# Patient Record
Sex: Female | Born: 1965 | Race: White | Hispanic: No | Marital: Married | Smoking: Never smoker
Health system: Southern US, Community
[De-identification: ages and names within clinical notes are randomized; demographics above are authoritative.]

## PROBLEM LIST (undated history)

## (undated) DIAGNOSIS — Z87442 Personal history of urinary calculi: Secondary | ICD-10-CM

## (undated) DIAGNOSIS — K8020 Calculus of gallbladder without cholecystitis without obstruction: Secondary | ICD-10-CM

## (undated) DIAGNOSIS — M1990 Unspecified osteoarthritis, unspecified site: Secondary | ICD-10-CM

## (undated) DIAGNOSIS — R7303 Prediabetes: Secondary | ICD-10-CM

## (undated) DIAGNOSIS — N200 Calculus of kidney: Secondary | ICD-10-CM

## (undated) DIAGNOSIS — K5792 Diverticulitis of intestine, part unspecified, without perforation or abscess without bleeding: Secondary | ICD-10-CM

## (undated) DIAGNOSIS — I10 Essential (primary) hypertension: Secondary | ICD-10-CM

## (undated) DIAGNOSIS — K5790 Diverticulosis of intestine, part unspecified, without perforation or abscess without bleeding: Secondary | ICD-10-CM

## (undated) DIAGNOSIS — E669 Obesity, unspecified: Secondary | ICD-10-CM

## (undated) DIAGNOSIS — E039 Hypothyroidism, unspecified: Secondary | ICD-10-CM

## (undated) HISTORY — PX: LAPAROSCOPIC TOTAL HYSTERECTOMY: SUR800

## (undated) HISTORY — DX: Unspecified osteoarthritis, unspecified site: M19.90

## (undated) HISTORY — DX: Obesity, unspecified: E66.9

## (undated) HISTORY — DX: Calculus of gallbladder without cholecystitis without obstruction: K80.20

## (undated) HISTORY — DX: Diverticulosis of intestine, part unspecified, without perforation or abscess without bleeding: K57.90

## (undated) HISTORY — DX: Hypothyroidism, unspecified: E03.9

## (undated) HISTORY — DX: Essential (primary) hypertension: I10

## (undated) HISTORY — PX: CHOLECYSTECTOMY: SHX55

## (undated) HISTORY — PX: ABDOMINAL HYSTERECTOMY: SHX81

## (undated) HISTORY — DX: Calculus of kidney: N20.0

---

## 1999-08-01 ENCOUNTER — Emergency Department (HOSPITAL_COMMUNITY): Admission: EM | Admit: 1999-08-01 | Discharge: 1999-08-01 | Payer: Self-pay | Admitting: Emergency Medicine

## 2000-04-01 ENCOUNTER — Other Ambulatory Visit: Admission: RE | Admit: 2000-04-01 | Discharge: 2000-04-01 | Payer: Self-pay | Admitting: Family Medicine

## 2004-02-16 HISTORY — PX: LAPAROSCOPIC TOTAL HYSTERECTOMY: SUR800

## 2005-02-15 HISTORY — PX: OTHER SURGICAL HISTORY: SHX169

## 2010-02-14 ENCOUNTER — Emergency Department (HOSPITAL_COMMUNITY)
Admission: EM | Admit: 2010-02-14 | Discharge: 2010-02-14 | Payer: Self-pay | Source: Home / Self Care | Admitting: Emergency Medicine

## 2012-04-15 ENCOUNTER — Encounter (HOSPITAL_COMMUNITY): Payer: Self-pay | Admitting: Physical Medicine and Rehabilitation

## 2012-04-15 ENCOUNTER — Emergency Department (HOSPITAL_COMMUNITY): Payer: BC Managed Care – PPO

## 2012-04-15 ENCOUNTER — Observation Stay (HOSPITAL_COMMUNITY)
Admission: EM | Admit: 2012-04-15 | Discharge: 2012-04-18 | Disposition: A | Discharge status: Home / Self Care | Payer: BC Managed Care – PPO | Attending: Internal Medicine | Admitting: Internal Medicine

## 2012-04-15 DIAGNOSIS — K5730 Diverticulosis of large intestine without perforation or abscess without bleeding: Secondary | ICD-10-CM | POA: Insufficient documentation

## 2012-04-15 DIAGNOSIS — R0602 Shortness of breath: Secondary | ICD-10-CM | POA: Insufficient documentation

## 2012-04-15 DIAGNOSIS — R1013 Epigastric pain: Principal | ICD-10-CM

## 2012-04-15 DIAGNOSIS — Z79899 Other long term (current) drug therapy: Secondary | ICD-10-CM | POA: Insufficient documentation

## 2012-04-15 DIAGNOSIS — Z7982 Long term (current) use of aspirin: Secondary | ICD-10-CM | POA: Insufficient documentation

## 2012-04-15 DIAGNOSIS — Z6841 Body Mass Index (BMI) 40.0 and over, adult: Secondary | ICD-10-CM

## 2012-04-15 DIAGNOSIS — R110 Nausea: Secondary | ICD-10-CM | POA: Insufficient documentation

## 2012-04-15 DIAGNOSIS — R0789 Other chest pain: Secondary | ICD-10-CM

## 2012-04-15 DIAGNOSIS — R52 Pain, unspecified: Secondary | ICD-10-CM | POA: Insufficient documentation

## 2012-04-15 DIAGNOSIS — E039 Hypothyroidism, unspecified: Secondary | ICD-10-CM

## 2012-04-15 DIAGNOSIS — R079 Chest pain, unspecified: Secondary | ICD-10-CM

## 2012-04-15 HISTORY — DX: Diverticulitis of intestine, part unspecified, without perforation or abscess without bleeding: K57.92

## 2012-04-15 LAB — BASIC METABOLIC PANEL
BUN: 9 mg/dL (ref 6–23)
Calcium: 9.6 mg/dL (ref 8.4–10.5)
GFR calc non Af Amer: >90 mL/min (ref >90)
Glucose, Bld: 121 mg/dL — ABNORMAL HIGH (ref 70–99)
Sodium: 140 mEq/L (ref 135–145)

## 2012-04-15 LAB — HEPATIC FUNCTION PANEL
ALT: 21 U/L (ref 0–35)
Alkaline Phosphatase: 84 U/L (ref 39–117)
Bilirubin, Direct: <0.1 mg/dL (ref 0.0–0.3)
Total Bilirubin: 0.2 mg/dL — ABNORMAL LOW (ref 0.3–1.2)

## 2012-04-15 LAB — URINE MICROSCOPIC-ADD ON

## 2012-04-15 LAB — CBC WITH DIFFERENTIAL/PLATELET
Eosinophils Absolute: 0.4 10*3/uL (ref 0.0–0.7)
Eosinophils Relative: 5 % (ref 0–5)
Lymphs Abs: 2.9 10*3/uL (ref 0.7–4.0)
MCH: 29.8 pg (ref 26.0–34.0)
MCV: 87.6 fL (ref 78.0–100.0)
Platelets: 275 10*3/uL (ref 150–400)
RBC: 5.33 MIL/uL — ABNORMAL HIGH (ref 3.87–5.11)

## 2012-04-15 LAB — URINALYSIS, ROUTINE W REFLEX MICROSCOPIC
Bilirubin Urine: NEGATIVE
Ketones, ur: NEGATIVE mg/dL
Protein, ur: NEGATIVE mg/dL
Urobilinogen, UA: 0.2 mg/dL (ref 0.0–1.0)

## 2012-04-15 LAB — POCT I-STAT TROPONIN I

## 2012-04-15 MED ORDER — ASPIRIN 81 MG PO CHEW
324.0000 mg | CHEWABLE_TABLET | Freq: Once | ORAL | Status: AC
  Administered 2012-04-15: 324 mg via ORAL
  Filled 2012-04-15: qty 4

## 2012-04-15 MED ORDER — GI COCKTAIL ~~LOC~~
30.0000 mL | Freq: Once | ORAL | Status: AC
  Administered 2012-04-15: 30 mL via ORAL
  Filled 2012-04-15: qty 30

## 2012-04-15 MED ORDER — PANTOPRAZOLE SODIUM 40 MG IV SOLR
40.0000 mg | Freq: Once | INTRAVENOUS | Status: AC
  Administered 2012-04-15: 40 mg via INTRAVENOUS
  Filled 2012-04-15: qty 40

## 2012-04-15 MED ORDER — ONDANSETRON HCL 4 MG/2ML IJ SOLN
4.0000 mg | Freq: Once | INTRAMUSCULAR | Status: AC
  Administered 2012-04-15: 4 mg via INTRAVENOUS
  Filled 2012-04-15: qty 2

## 2012-04-15 NOTE — ED Notes (Signed)
Pt presents to department for evaluation of midsternal non radiating chest pain. Sudden onset today. Pt states several episodes of intermittent pain. 3/10 at the time, increases with deep breathing. States "I feel like something is sitting on my chest." respirations unlabored. Pt is alert and oriented x4.

## 2012-04-15 NOTE — ED Provider Notes (Signed)
History     CSN: [id number]  Arrival date & time 04/15/12  1800   None     Chief Complaint  Patient presents with  . Chest Pain  . Shortness of Breath    (Consider location/radiation/quality/duration/timing/severity/associated sxs/prior treatment) HPI History provided by pt.   Pt developed non-radiating pain mid-line chest, described as an elephant laying on top of her, while dressing herself at 3:30pm today.  Intermittent, aggravated by walking and associated w/ SOB and nausea.  No pain currently but still nauseous.  Has had numbness of left hand for the past 3 days.  Has had intermittent pain in her abdomen as well.   Recent cough; no fever.  Denies trauma to chest and recent heavy lifting.  No RF for PE and denies LE edema/pain. Does not smoke cigarettes and no immediate FH MI.  Had a stress test at [facility] 4-5 years ago.   Past Medical History  Diagnosis Date  . Thyroid disease   . Diverticulitis     No past surgical history on file.  No family history on file.  History  Substance Use Topics  . Smoking status: Never Smoker   . Smokeless tobacco: Not on file  . Alcohol Use: No    OB History   Grav Para Term Preterm Abortions TAB SAB Ect Mult Living                  Review of Systems  All other systems reviewed and are negative.    Allergies  Review of patient's allergies indicates no known allergies.  Home Medications   Current Outpatient Rx  Name  Route  Sig  Dispense  Refill  . aspirin EC 81 MG tablet   Oral   Take 81 mg by mouth daily.         Marland Kitchen levothyroxine (SYNTHROID, LEVOTHROID) 50 MCG tablet   Oral   Take 50 mcg by mouth daily.           BP 150/89  Pulse 74  Temp(Src) 97.6 F (36.4 C) (Oral)  Resp 18  SpO2 98%  Physical Exam  Nursing note and vitals reviewed. Constitutional: She is oriented to person, place, and time. She appears well-developed and well-nourished. No distress.  obese  HENT:  Head:  Normocephalic and atraumatic.  Eyes:  Normal appearance  Neck: Normal range of motion.  Cardiovascular: Normal rate, regular rhythm and intact distal pulses.   Pulmonary/Chest: Effort normal and breath sounds normal. No respiratory distress. She exhibits no tenderness.  No pleuritic pain reported  Abdominal: Soft. Bowel sounds are normal. She exhibits no distension. There is no guarding.  Diffuse tenderness but most pronounced in RUQ and epigastric.  Surgical scars from cholecystectomy.  Musculoskeletal: Normal range of motion.  No peripheral edema or calf tenderness  Neurological: She is alert and oriented to person, place, and time.  Skin: Skin is warm and dry. No rash noted.  Psychiatric: She has a normal mood and affect. Her behavior is normal.    ED Course  Procedures (including critical care time)   Date: 04/16/2012  Rate: 65  Rhythm: normal sinus rhythm  QRS Axis: left  Intervals: normal  ST/T Wave abnormalities: normal  Conduction Disutrbances:none  Narrative Interpretation:   Old EKG Reviewed: unchanged   Labs Reviewed  CBC WITH DIFFERENTIAL - Abnormal; Notable for the following:    RBC 5.33 (*)    Hemoglobin 15.9 (*)    HCT 46.7 (*)    All  other components within normal limits  BASIC METABOLIC PANEL - Abnormal; Notable for the following:    Glucose, Bld 121 (*)    All other components within normal limits  HEPATIC FUNCTION PANEL  LIPASE, BLOOD  URINALYSIS, ROUTINE W REFLEX MICROSCOPIC  POCT I-STAT TROPONIN I   Dg Chest 2 View  04/15/2012  *RADIOLOGY REPORT*  Clinical Data: Chest pain with shortness of breath  CHEST - 2 VIEW  Comparison: 08/08/2011  Findings: The right diaphragm is moderately elevated which is stable.  The heart size and vascular pattern are normal.  The lungs are clear.  There are no pleural effusions.  IMPRESSION: No acute findings.   Original Report Authenticated By: Esperanza Heir, M.D.      1. Chest pain   2. Abdominal pain, acute,  epigastric   3. Chest pain on exertion       MDM  47yo F presents w/ chest pain.  Low risk CAD but characteristics of pain are concerning for ACS.  EKG non-ischemic and serial troponins negative.  Currently pain free and has received aspirin.  She has also received 4mg  IV morphine for pain in abdomen.  Pt does not meet requirements for ED CP protocol because BMI 41 and unable to walk on treadmill d/t arthritis.   [facility] consulted for admission.          Otilio Miu, PA-C 04/16/12 2135

## 2012-04-16 ENCOUNTER — Encounter (HOSPITAL_COMMUNITY): Payer: Self-pay | Admitting: Internal Medicine

## 2012-04-16 DIAGNOSIS — R079 Chest pain, unspecified: Secondary | ICD-10-CM

## 2012-04-16 DIAGNOSIS — R1013 Epigastric pain: Secondary | ICD-10-CM

## 2012-04-16 HISTORY — DX: Morbid (severe) obesity due to excess calories: E66.01

## 2012-04-16 HISTORY — DX: Epigastric pain: R10.13

## 2012-04-16 HISTORY — DX: Chest pain, unspecified: R07.9

## 2012-04-16 LAB — TROPONIN I
Troponin I: <0.3 ng/mL (ref <0.30)
Troponin I: <0.3 ng/mL (ref <0.30)

## 2012-04-16 LAB — LIPID PANEL
Cholesterol: 183 mg/dL (ref 0–200)
LDL Cholesterol: 112 mg/dL — ABNORMAL HIGH (ref 0–99)
Triglycerides: 117 mg/dL (ref <150)
VLDL: 23 mg/dL (ref 0–40)

## 2012-04-16 LAB — CBC
Hemoglobin: 14.5 g/dL (ref 12.0–15.0)
MCH: 29.1 pg (ref 26.0–34.0)
MCV: 87.6 fL (ref 78.0–100.0)
RBC: 4.98 MIL/uL (ref 3.87–5.11)
WBC: 9.3 10*3/uL (ref 4.0–10.5)

## 2012-04-16 LAB — COMPREHENSIVE METABOLIC PANEL
AST: 24 U/L (ref 0–37)
Albumin: 3.2 g/dL — ABNORMAL LOW (ref 3.5–5.2)
BUN: 11 mg/dL (ref 6–23)
Calcium: 9.1 mg/dL (ref 8.4–10.5)
Creatinine, Ser: 0.78 mg/dL (ref 0.50–1.10)

## 2012-04-16 LAB — POCT I-STAT TROPONIN I: Troponin i, poc: 0 ng/mL (ref 0.00–0.08)

## 2012-04-16 LAB — HEMOGLOBIN A1C
Hgb A1c MFr Bld: 5.7 % — ABNORMAL HIGH (ref <5.7)
Mean Plasma Glucose: 117 mg/dL — ABNORMAL HIGH (ref <117)

## 2012-04-16 LAB — MAGNESIUM: Magnesium: 2.2 mg/dL (ref 1.5–2.5)

## 2012-04-16 MED ORDER — PROMETHAZINE HCL 25 MG/ML IJ SOLN
12.5000 mg | Freq: Four times a day (QID) | INTRAMUSCULAR | Status: DC | PRN
  Administered 2012-04-16: 12.5 mg via INTRAVENOUS
  Filled 2012-04-16: qty 1

## 2012-04-16 MED ORDER — DOCUSATE SODIUM 100 MG PO CAPS
100.0000 mg | ORAL_CAPSULE | Freq: Two times a day (BID) | ORAL | Status: DC
  Administered 2012-04-16 – 2012-04-17 (×3): 100 mg via ORAL
  Filled 2012-04-16 (×6): qty 1

## 2012-04-16 MED ORDER — ACETAMINOPHEN 650 MG RE SUPP: 650.0000 mg | Freq: Four times a day (QID) | RECTAL | Status: DC | PRN

## 2012-04-16 MED ORDER — SODIUM CHLORIDE 0.9 % IV SOLN
INTRAVENOUS | Status: AC
  Administered 2012-04-16: 03:00:00 via INTRAVENOUS

## 2012-04-16 MED ORDER — MORPHINE SULFATE 4 MG/ML IJ SOLN
4.0000 mg | Freq: Once | INTRAMUSCULAR | Status: AC
  Administered 2012-04-16: 4 mg via INTRAVENOUS
  Filled 2012-04-16: qty 1

## 2012-04-16 MED ORDER — HYDROCODONE-ACETAMINOPHEN 5-325 MG PO TABS
1.0000 | ORAL_TABLET | ORAL | Status: DC | PRN
  Administered 2012-04-16: 1 via ORAL
  Filled 2012-04-16: qty 1

## 2012-04-16 MED ORDER — ENOXAPARIN SODIUM 40 MG/0.4ML ~~LOC~~ SOLN
40.0000 mg | SUBCUTANEOUS | Status: DC
  Administered 2012-04-16: 40 mg via SUBCUTANEOUS
  Filled 2012-04-16 (×3): qty 0.4

## 2012-04-16 MED ORDER — LEVOTHYROXINE SODIUM 50 MCG PO TABS
50.0000 ug | ORAL_TABLET | Freq: Every day | ORAL | Status: DC
  Administered 2012-04-16 – 2012-04-17 (×2): 50 ug via ORAL
  Filled 2012-04-16 (×3): qty 1

## 2012-04-16 MED ORDER — MORPHINE SULFATE 2 MG/ML IJ SOLN
2.0000 mg | INTRAMUSCULAR | Status: DC | PRN
  Administered 2012-04-16 (×2): 2 mg via INTRAVENOUS
  Filled 2012-04-16 (×2): qty 1

## 2012-04-16 MED ORDER — ACETAMINOPHEN 325 MG PO TABS
650.0000 mg | ORAL_TABLET | Freq: Four times a day (QID) | ORAL | Status: DC | PRN
  Administered 2012-04-17 – 2012-04-18 (×2): 650 mg via ORAL
  Filled 2012-04-16 (×2): qty 2

## 2012-04-16 MED ORDER — ONDANSETRON HCL 4 MG/2ML IJ SOLN
4.0000 mg | Freq: Four times a day (QID) | INTRAMUSCULAR | Status: DC | PRN
  Administered 2012-04-16 (×2): 4 mg via INTRAVENOUS
  Filled 2012-04-16 (×2): qty 2

## 2012-04-16 MED ORDER — ASPIRIN EC 81 MG PO TBEC
81.0000 mg | DELAYED_RELEASE_TABLET | Freq: Every day | ORAL | Status: DC
  Administered 2012-04-16 – 2012-04-17 (×2): 81 mg via ORAL
  Filled 2012-04-16 (×3): qty 1

## 2012-04-16 MED ORDER — PANTOPRAZOLE SODIUM 40 MG PO TBEC
40.0000 mg | DELAYED_RELEASE_TABLET | Freq: Every day | ORAL | Status: DC
  Administered 2012-04-16 – 2012-04-17 (×2): 40 mg via ORAL
  Filled 2012-04-16 (×2): qty 1

## 2012-04-16 MED ORDER — ONDANSETRON HCL 4 MG/2ML IJ SOLN: 4.0000 mg | Freq: Three times a day (TID) | INTRAMUSCULAR | Status: DC | PRN

## 2012-04-16 MED ORDER — SODIUM CHLORIDE 0.9 % IJ SOLN
3.0000 mL | Freq: Two times a day (BID) | INTRAMUSCULAR | Status: DC
  Administered 2012-04-16 – 2012-04-17 (×2): 3 mL via INTRAVENOUS

## 2012-04-16 MED ORDER — ONDANSETRON HCL 4 MG PO TABS: 4.0000 mg | ORAL_TABLET | Freq: Four times a day (QID) | ORAL | Status: DC | PRN

## 2012-04-16 MED ORDER — MORPHINE SULFATE 4 MG/ML IJ SOLN
4.0000 mg | Freq: Once | INTRAMUSCULAR | Status: AC
  Administered 2012-04-16: 2 mg via INTRAVENOUS
  Filled 2012-04-16: qty 1

## 2012-04-16 NOTE — Progress Notes (Addendum)
[facility] PROGRESS NOTE  Kristena Wilhelmi FAO:[id number] DOB: Oct 01, 1965 DOA: 04/15/2012 PCP: Dema Severin, NP  Brief Narrative: Autumn [name] is a 47 y.o. female has a past medical history of Thyroid disease and Diverticulitis. Presented with Chest pain started around 3 pm and at first was central in her chest at the time she was getting dressed for work. The pain seemed to be worse when when tried to get up and walk. She felt the pain was worse with deep breathes. This was associated with shortness of breath and nausea. The pain was waxing and waining but resolved while in ED. She has been chest pain free for some time and then developed RUQ and epigastric pain that was described as spasm lasting about 15 min. Patient is sp cholecystectomy for the past 1 year. She states she has occasional epigastric pain at night. Her lipase and LFT's were within normal limits earlier. No evidence of leukocytosis. Hospitalist was called for admition for further work up. At this time patient is chest pain free and her abdominal pain is resolving.   Assessment/Plan: 1. Chest pain on exertion - atypical presentation with pleuritic component. EKG grossly unchanged this morning when compared to last night. T wave are flattened in V1-3, III and aVF but I do not appreciate changes since last night. Negative troponin this morning, 2 more values pending. Echo pending. Will repeat EKG this afternoon. If EKG changes, troponin is positive or echo is abnormal, will consult cardiology. She is chest pain free now. She seems to have 2 types of pain, the chest pain that was yesterday for ~5 min which is somewhat pleuritic in nature and the epigastric pain which is more constant. Will continue to closely monitor.   2. Abdominal pain, acute, epigastric - LFT's and lipase wnl, she is sp cholecystectomy, repeat lipase in AM.  if persistent pain may need further work up.   3. Hypothyroidism - TSH pending.   Code Status: Full Family  Communication: none  Disposition Plan: TBD  Consultants:  none  Procedures:  none  Antibiotics:  none  HPI/Subjective: - endorses mild HA and epigastric pain this morning  Objective: Filed Vitals:   04/15/12 2306 04/16/12 0015 04/16/12 0130 04/16/12 0255  BP: 130/66 126/70 121/43 136/81  Pulse: 67 57 63 64  Temp:    97.5 F (36.4 C)  TempSrc:      Resp: 16 19 14 15   Height:    5\' 7"  (1.702 m)  Weight:    121.7 kg (268 lb 4.8 oz)  SpO2: 93% 95% 94% 94%    Intake/Output Summary (Last 24 hours) at 04/16/12 6962 Last data filed at 04/16/12 0700  Gross per 24 hour  Intake    285 ml  Output      0 ml  Net    285 ml   Filed Weights   04/16/12 0255  Weight: 121.7 kg (268 lb 4.8 oz)    Exam:   General:  NAD, obese caucasian F   Cardiovascular: regular rate and rhythm, without MRG  Respiratory: good air movement, clear to auscultation throughout, no wheezing, ronchi or rales  Abdomen: soft, mild tenderness to palpation, positive bowel sounds  MSK: no peripheral edema  Neuro: CN 2-12 grossly intact, MS 5/5 in all 4  Data Reviewed: Basic Metabolic Panel:  Recent Labs Lab 04/15/12 1836 04/16/12 0519  NA 140 139  K 4.3 3.8  CL 103 104  CO2 28 27  GLUCOSE 121* 107*  BUN 9 11  CREATININE 0.63 0.78  CALCIUM 9.6 9.1  MG  --  2.2  PHOS  --  3.9   Liver Function Tests:  Recent Labs Lab 04/15/12 2229 04/16/12 0519  AST 23 24  ALT 21 23  ALKPHOS 84 79  BILITOT 0.2* 0.3  PROT 7.3 6.7  ALBUMIN 3.4* 3.2*    Recent Labs Lab 04/15/12 2229 04/16/12 0519  LIPASE 22 27   CBC:  Recent Labs Lab 04/15/12 1836 04/16/12 0519  WBC 9.3 9.3  NEUTROABS 5.0  --   HGB 15.9* 14.5  HCT 46.7* 43.6  MCV 87.6 87.6  PLT 275 258   Cardiac Enzymes:  Recent Labs Lab 04/16/12 0519  TROPONINI <0.30   Studies: Dg Chest 2 View  04/15/2012  *RADIOLOGY REPORT*  Clinical Data: Chest pain with shortness of breath  CHEST - 2 VIEW  Comparison: 08/08/2011   Findings: The right diaphragm is moderately elevated which is stable.  The heart size and vascular pattern are normal.  The lungs are clear.  There are no pleural effusions.  IMPRESSION: No acute findings.   Original Report Authenticated By: Esperanza Heir, M.D.     Scheduled Meds: . aspirin EC  81 mg Oral Daily  . docusate sodium  100 mg Oral BID  . enoxaparin (LOVENOX) injection  40 mg Subcutaneous Q24H  . levothyroxine  50 mcg Oral QAC breakfast  . pantoprazole  40 mg Oral Q1200  . sodium chloride  3 mL Intravenous Q12H   Continuous Infusions: . sodium chloride 75 mL/hr at 04/16/12 1610    Active Problems:   Chest pain on exertion   Morbid obesity with BMI of 40.0-44.9, adult   Abdominal pain, acute, epigastric  Time spent: 8  Pamella Pert, MD [facility] Pager [phone]. If 7 PM - 7 AM, please contact night-coverage at [email or web address], password [facility] 04/16/2012, 8:22 AM  LOS: 1 day

## 2012-04-16 NOTE — H&P (Signed)
PCP:  Dema Severin, NP [facility] [place]   Chief Complaint:   Chest pain  HPI: Autumn [name] is a 47 y.o. female   has a past medical history of Thyroid disease and Diverticulitis.   Presented with  Chest pain started around 3 pm and at first was central in her chest at the time she was getting dressed for work. The pain seemed to be worse when when tried to get up and walk. She felt the pain was worse with deep breathes. This was associated with shortness of breath and nausea. The pain was waxing and waining but resolved while in ED. She has been chest pain free for some time and then developed RUQ and epigastric pain that was described as spasm lasting about 15 min.  Patient is sp cholecystectomy for the past 1 year. She states she has occasional epigastric pain at night.  Her lipase and LFT's were within normal limits earlier. No evidence of leukocytosis. Hospitalist was called for admition for further work up. At this time patient is chest pain free and her abdominal pain is resolving.   Review of Systems:    Pertinent positives include: chills, abdominal pain, nausea, chest pain,   Constitutional:  No weight loss, night sweats, Fevers, fatigue, weight loss HEENT:  No headaches, Difficulty swallowing,Tooth/dental problems,Sore throat,  No sneezing, itching, ear ache, nasal congestion, post nasal drip,  Cardio-vascular:  No Orthopnea, PND, anasarca, dizziness, palpitations.no Bilateral lower extremity swelling  GI:  No heartburn, indigestion,  vomiting, diarrhea, change in bowel habits, loss of appetite, melena, blood in stool, hematemesis Resp:  no shortness of breath at rest. No dyspnea on exertion, No excess mucus, no productive cough, No non-productive cough, No coughing up of blood.No change in color of mucus.No wheezing. Skin:  no rash or lesions. No jaundice GU:  no dysuria, change in color of urine, no urgency or frequency. No straining to urinate.  No flank pain.   Musculoskeletal:  No joint pain or no joint swelling. No decreased range of motion. No back pain.  Psych:  No change in mood or affect. No depression or anxiety. No memory loss.  Neuro: no localizing neurological complaints, no tingling, no weakness, no double vision, no gait abnormality, no slurred speech, no confusion  Otherwise ROS are negative except for above, 10 systems were reviewed  Past Medical History: Past Medical History  Diagnosis Date  . Thyroid disease   . Diverticulitis    History reviewed. No pertinent past surgical history.   Medications: Prior to Admission medications   Medication Sig Start Date End Date Taking? Authorizing Provider  aspirin EC 81 MG tablet Take 81 mg by mouth daily.   Yes Historical Provider, MD  levothyroxine (SYNTHROID, LEVOTHROID) 50 MCG tablet Take 50 mcg by mouth daily.   Yes Historical Provider, MD    Allergies:  No Known Allergies  Social History:  Ambulatory independently   Lives at  Home with family   reports that she has never smoked. She does not have any smokeless tobacco history on file. She reports that she does not drink alcohol or use illicit drugs.   Family History: family history includes Diabetes in her mother and Hypertension in her mother.    Physical Exam: Patient Vitals for the past 24 hrs:  BP Temp Temp src Pulse Resp SpO2  04/16/12 0015 126/70 mmHg - - 57 19 95 %  04/15/12 2306 130/66 mmHg - - 67 16 93 %  04/15/12 1813 150/89 mmHg 97.6  F (36.4 C) Oral 74 18 98 %    1. General:  in No Acute distress 2. Psychological: Alert and   Oriented 3. Head/ENT:   Moist   Mucous Membranes                          Head Non traumatic, neck supple                          Normal   Dentition 4. SKIN:   decreased Skin turgor,  Skin clean Dry and intact no rash 5. Heart: Regular rate and rhythm no Murmur, Rub or gallop 6. Lungs: Clear to auscultation bilaterally, no wheezes or crackles   7. Abdomen: Soft, epigastric  tenderness, Non distended 8. Lower extremities: no clubbing, cyanosis, or edema 9. Neurologically Grossly intact, moving all 4 extremities equally 10. MSK: Normal range of motion  body mass index is unknown because there is no height or weight on file.   Labs on Admission:   Recent Labs  04/15/12 1836  NA 140  K 4.3  CL 103  CO2 28  GLUCOSE 121*  BUN 9  CREATININE 0.63  CALCIUM 9.6    Recent Labs  04/15/12 2229  AST 23  ALT 21  ALKPHOS 84  BILITOT 0.2*  PROT 7.3  ALBUMIN 3.4*    Recent Labs  04/15/12 2229  LIPASE 22    Recent Labs  04/15/12 1836  WBC 9.3  NEUTROABS 5.0  HGB 15.9*  HCT 46.7*  MCV 87.6  PLT 275   No results found for this basename: CKTOTAL, CKMB, CKMBINDEX, TROPONINI,  in the last 72 hours No results found for this basename: TSH, T4TOTAL, FREET3, T3FREE, THYROIDAB,  in the last 72 hours No results found for this basename: VITAMINB12, FOLATE, FERRITIN, TIBC, IRON, RETICCTPCT,  in the last 72 hours No results found for this basename: HGBA1C    CrCl is unknown because there is no height on file for the current visit. ABG No results found for this basename: phart, pco2, po2, hco3, tco2, acidbasedef, o2sat     No results found for this basename: DDIMER     Other results:  I have pearsonaly reviewed this: ECG REPORT  Rate: 67  Rhythm: NSR ST&T Change: flat T waves in III, aVF, V3  UA no evidece of infection   Cultures: No results found for this basename: sdes, specrequest, cult, reptstatus       Radiological Exams on Admission: Dg Chest 2 View  04/15/2012  *RADIOLOGY REPORT*  Clinical Data: Chest pain with shortness of breath  CHEST - 2 VIEW  Comparison: 08/08/2011  Findings: The right diaphragm is moderately elevated which is stable.  The heart size and vascular pattern are normal.  The lungs are clear.  There are no pleural effusions.  IMPRESSION: No acute findings.   Original Report Authenticated By: Esperanza Heir, M.D.      Chart has been reviewed  Assessment/Plan  47 yo F w hx of morbid obesity with BMI of 41.   Present on Admission:  . Chest pain on exertion - atypical presentation with pleuritic component. CE negative x2 continue to cycle, will obtain d.dimer and repeat ECG . Abdominal pain, acute, epigastric  - LFT's and lipase wnl, she is sp cholecystectomy, repeat lipase in AM.   if persistent pain may need further work up.    Prophylaxis:  Lovenox, Protonix  CODE STATUS: FULL  CODE  Other plan as per orders.  I have spent a total of 55 min on this admission  Tocarra Gassen 04/16/2012, 1:18 AM

## 2012-04-16 NOTE — Progress Notes (Signed)
MD text paged on 12 lead done this AM indicating TWI in leads V1 - V3. awaitinf for reply. Also pt continues to c/o dull pain on the Right  Upper Quadrant area. She is had  also  been c/o of headache at 9/10 and mild nausea. Zofran 4 mg and Morphine given.  Thanks . Ancil Linsey RN

## 2012-04-17 MED ORDER — LEVOTHYROXINE SODIUM 75 MCG PO TABS
75.0000 ug | ORAL_TABLET | Freq: Every day | ORAL | Status: DC
  Administered 2012-04-18: 75 ug via ORAL
  Filled 2012-04-17 (×2): qty 1

## 2012-04-17 MED ORDER — LEVOTHYROXINE SODIUM 25 MCG PO TABS
25.0000 ug | ORAL_TABLET | Freq: Once | ORAL | Status: AC
  Administered 2012-04-17: 25 ug via ORAL
  Filled 2012-04-17: qty 1

## 2012-04-17 NOTE — ED Provider Notes (Signed)
Medical screening examination/treatment/procedure(s) were performed by non-physician practitioner and as supervising physician I was immediately available for consultation/collaboration.   Anabeth Chilcott L Harlene Petralia, MD 04/17/12 2333 

## 2012-04-17 NOTE — Progress Notes (Signed)
Notified by CCMD pts HR dropping to low 40's.  PT sleeping soundly--aroused easily--no complaints voiced.  Will continue to monitor. Dierdre Highman, RN

## 2012-04-17 NOTE — Progress Notes (Signed)
[facility] PROGRESS NOTE  Autumn [name] JXB:[id number] DOB: 1965/10/16 DOA: 04/15/2012 PCP: Dema Severin, NP  Brief Narrative: Autumn [name] is a 47 y.o. female has a past medical history of Thyroid disease and Diverticulitis. Presented with Chest pain started around 3 pm and at first was central in her chest at the time she was getting dressed for work. The pain seemed to be worse when when tried to get up and walk. She felt the pain was worse with deep breathes. This was associated with shortness of breath and nausea. The pain was waxing and waining but resolved while in ED. She has been chest pain free for some time and then developed RUQ and epigastric pain that was described as spasm lasting about 15 min. Patient is sp cholecystectomy for the past 1 year. She states she has occasional epigastric pain at night. Her lipase and LFT's were within normal limits earlier. No evidence of leukocytosis. Hospitalist was called for admition for further work up. At this time patient is chest pain free and her abdominal pain is resolving.   Assessment/Plan: 1. Chest pain on exertion - atypical presentation with pleuritic component. TTE pending.   2. Hypothyroidism - per patient, diagnosed at the end of January and her TSH was ~7 at that time. She has been taking synthroid 50 mcg daily and seems to take this medication correctly. TSH remains elevated and she is having bradycardia, endorses fatigue and fluid retention still. Will increase synthroid to 75 starting today. TTE pending.   3. Abdominal pain, acute, epigastric - LFT's and lipase wnl, she is sp cholecystectomy, repeat lipase in AM.  if persistent pain may need further work up.   4. Hypothyroidism - TSH pending.   Code Status: Full Family Communication: none  Disposition Plan: TBD  Consultants:  none  Procedures:  none  Antibiotics:  none  HPI/Subjective: - endorses mild HA and epigastric pain this morning  Objective: Filed  Vitals:   04/16/12 0255 04/16/12 1421 04/16/12 2100 04/17/12 0542  BP: 136/81 123/76 105/62 98/50  Pulse: 64 57 61 54  Temp: 97.5 F (36.4 C) 98.1 F (36.7 C) 98.1 F (36.7 C) 97.9 F (36.6 C)  TempSrc:  Oral Oral Oral  Resp: 15 18 18 18   Height: 5\' 7"  (1.702 m)     Weight: 121.7 kg (268 lb 4.8 oz)   120.9 kg (266 lb 8.6 oz)  SpO2: 94% 96% 98% 96%    Intake/Output Summary (Last 24 hours) at 04/17/12 1259 Last data filed at 04/17/12 0800  Gross per 24 hour  Intake    400 ml  Output      0 ml  Net    400 ml   Filed Weights   04/16/12 0255 04/17/12 0542  Weight: 121.7 kg (268 lb 4.8 oz) 120.9 kg (266 lb 8.6 oz)    Exam:   General:  NAD, obese caucasian F   Cardiovascular: regular rate and rhythm, without MRG  Respiratory: good air movement, clear to auscultation throughout, no wheezing, ronchi or rales  Abdomen: soft, mild tenderness to palpation, positive bowel sounds  MSK: no peripheral edema  Neuro: CN 2-12 grossly intact, MS 5/5 in all 4  Data Reviewed: Basic Metabolic Panel:  Recent Labs Lab 04/15/12 1836 04/16/12 0519  NA 140 139  K 4.3 3.8  CL 103 104  CO2 28 27  GLUCOSE 121* 107*  BUN 9 11  CREATININE 0.63 0.78  CALCIUM 9.6 9.1  MG  --  2.2  PHOS  --  3.9   Liver Function Tests:  Recent Labs Lab 04/15/12 2229 04/16/12 0519  AST 23 24  ALT 21 23  ALKPHOS 84 79  BILITOT 0.2* 0.3  PROT 7.3 6.7  ALBUMIN 3.4* 3.2*    Recent Labs Lab 04/15/12 2229 04/16/12 0519  LIPASE 22 27   CBC:  Recent Labs Lab 04/15/12 1836 04/16/12 0519  WBC 9.3 9.3  NEUTROABS 5.0  --   HGB 15.9* 14.5  HCT 46.7* 43.6  MCV 87.6 87.6  PLT 275 258   Cardiac Enzymes:  Recent Labs Lab 04/16/12 0519 04/16/12 1100 04/16/12 1619  TROPONINI <0.30 <0.30 <0.30   Studies: Dg Chest 2 View  04/15/2012  *RADIOLOGY REPORT*  Clinical Data: Chest pain with shortness of breath  CHEST - 2 VIEW  Comparison: 08/08/2011  Findings: The right diaphragm is moderately  elevated which is stable.  The heart size and vascular pattern are normal.  The lungs are clear.  There are no pleural effusions.  IMPRESSION: No acute findings.   Original Report Authenticated By: Esperanza Heir, M.D.     Scheduled Meds: . aspirin EC  81 mg Oral Daily  . docusate sodium  100 mg Oral BID  . enoxaparin (LOVENOX) injection  40 mg Subcutaneous Q24H  . [START ON 04/18/2012] levothyroxine  75 mcg Oral QAC breakfast  . pantoprazole  40 mg Oral Q1200  . sodium chloride  3 mL Intravenous Q12H   Continuous Infusions:    Active Problems:   Chest pain on exertion   Morbid obesity with BMI of 40.0-44.9, adult   Abdominal pain, acute, epigastric  Pamella Pert, MD [facility] Pager [phone]. If 7 PM - 7 AM, please contact night-coverage at [email or web address], password [facility] 04/17/2012, 12:59 PM  LOS: 2 days

## 2012-04-17 NOTE — Progress Notes (Signed)
*  PRELIMINARY RESULTS* Echocardiogram 2D Echocardiogram has been performed.  [name], Autumn 04/17/2012, 4:27 PM 

## 2012-04-18 MED ORDER — LEVOTHYROXINE SODIUM 75 MCG PO TABS: 75.0000 ug | ORAL_TABLET | Freq: Every day | ORAL | Status: DC

## 2012-04-18 NOTE — Discharge Summary (Signed)
Physician Discharge Summary  Autumn [name] QIO:[id number] DOB: [birth year] DOA: 04/15/2012  PCP: Dema Severin, NP  Admit date: 04/15/2012 Discharge date: 04/18/2012  Time spent: 35 minutes  Recommendations for Outpatient Follow-up:  1. Please follow up with PCP next week  Discharge Diagnoses:  Active Problems:   Chest pain on exertion   Morbid obesity with BMI of 40.0-44.9, adult   Abdominal pain, acute, epigastric  Discharge Condition: Stable  Diet recommendation: Heart healthy  Filed Weights   04/16/12 0255 04/17/12 0542 04/18/12 0555  Weight: 121.7 kg (268 lb 4.8 oz) 120.9 kg (266 lb 8.6 oz) 121 kg (266 lb 12.1 oz)    History of present illness:  Autumn [name] is a 47 y.o. female has a past medical history of Thyroid disease and Diverticulitis. Presented with Chest pain started around 3 pm and at first was central in her chest at the time she was getting dressed for work. The pain seemed to be worse when when tried to get up and walk. She felt the pain was worse with deep breathes. This was associated with shortness of breath and nausea. The pain was waxing and waining but resolved while in ED. She has been chest pain free for some time and then developed RUQ and epigastric pain that was described as spasm lasting about 15 min. Patient is sp cholecystectomy for the past 1 year. She states she has occasional epigastric pain at night. Her lipase and LFT's were within normal limits [facility] Course:  Chest pain - Patient had atypical presentation with pleuritic component. EKG showed T wave flattened in V1-3, III and aVF but we have no previous EKG to compare it with, and repeat EKGs were without change. Troponins were negative throughout her hospitalization. 2-D echocardiogram showed normal systolic function and normal diastolic function.  Her chest pain resolved after admission, the patient did not experience chest pain while hospitalized. On initial lab workup, her TSH came back  elevated at 8. She was recently diagnosed with hypothyroidism by her PCP and placed on Synthroid 50 mcg daily, and that was about 5 weeks ago. I increased her dose of Synthroid to 75 mcg daily, instructed patient to followup with PCP. She already has an appointment next week. As far as her abdominal pain is concerned, her abdominal pain resolved, liver function tests and lipase were within normal limits.  Procedures:  2D echo Study Conclusions  - Left ventricle: The cavity size was normal. Wall thickness was normal. Systolic function was normal. The estimated ejection fraction was in the range of 55% to 60%. Left ventricular diastolic function parameters were normal. - Left atrium: The atrium was mildly dilated.  Consultations:  none  Discharge Exam: Filed Vitals:   04/17/12 0542 04/17/12 1400 04/17/12 2100 04/18/12 0555  BP: 98/50 110/52 109/62 107/63  Pulse: 54 68 61 57  Temp: 97.9 F (36.6 C) 97.8 F (36.6 C) 97.9 F (36.6 C) 98 F (36.7 C)  TempSrc: Oral  Oral Oral  Resp: 18 17 18 18   Height:      Weight: 120.9 kg (266 lb 8.6 oz)   121 kg (266 lb 12.1 oz)  SpO2: 96% 94% 95% 95%    General: NAD Cardiovascular: RRR without MRG Respiratory: CTA biL  Discharge Instructions     Medication List    TAKE these medications       aspirin EC 81 MG tablet  Take 81 mg by mouth daily.     levothyroxine 75 MCG tablet  Commonly  known as:  SYNTHROID, LEVOTHROID  Take 1 tablet (75 mcg total) by mouth daily before breakfast.       The results of significant diagnostics from this hospitalization (including imaging, microbiology, ancillary and laboratory) are listed below for reference.    Significant Diagnostic Studies: Dg Chest 2 View  04/15/2012  *RADIOLOGY REPORT*  Clinical Data: Chest pain with shortness of breath  CHEST - 2 VIEW  Comparison: 08/08/2011  Findings: The right diaphragm is moderately elevated which is stable.  The heart size and vascular pattern are normal.   The lungs are clear.  There are no pleural effusions.  IMPRESSION: No acute findings.   Original Report Authenticated By: Esperanza Heir, M.D.    Labs: Basic Metabolic Panel:  Recent Labs Lab 04/15/12 1836 04/16/12 0519  NA 140 139  K 4.3 3.8  CL 103 104  CO2 28 27  GLUCOSE 121* 107*  BUN 9 11  CREATININE 0.63 0.78  CALCIUM 9.6 9.1  MG  --  2.2  PHOS  --  3.9   Lab Results  Component Value Date   TSH 8.117* 04/16/2012   Liver Function Tests:  Recent Labs Lab 04/15/12 2229 04/16/12 0519  AST 23 24  ALT 21 23  ALKPHOS 84 79  BILITOT 0.2* 0.3  PROT 7.3 6.7  ALBUMIN 3.4* 3.2*    Recent Labs Lab 04/15/12 2229 04/16/12 0519  LIPASE 22 27   CBC:  Recent Labs Lab 04/15/12 1836 04/16/12 0519  WBC 9.3 9.3  NEUTROABS 5.0  --   HGB 15.9* 14.5  HCT 46.7* 43.6  MCV 87.6 87.6  PLT 275 258   Cardiac Enzymes:  Recent Labs Lab 04/16/12 0519 04/16/12 1100 04/16/12 1619  TROPONINI <0.30 <0.30 <0.30    Signed:  Pamella Pert  [facility] 04/18/2012, 11:50 AM

## 2012-04-18 NOTE — Progress Notes (Signed)
Pt provided with dc instructions and education. Pt verbalized understanding. Pt aware of increase in synthroid and provided with prescription. Pt has no complaints at this time. IV removed with tip intact. Heart monitor cleaned and returned to front. Levonne Spiller, RN

## 2012-04-20 ENCOUNTER — Encounter: Payer: Self-pay | Admitting: Internal Medicine

## 2012-05-09 ENCOUNTER — Encounter: Payer: Self-pay | Admitting: Internal Medicine

## 2012-05-11 ENCOUNTER — Encounter: Payer: Self-pay | Admitting: Internal Medicine

## 2012-05-11 ENCOUNTER — Ambulatory Visit (INDEPENDENT_AMBULATORY_CARE_PROVIDER_SITE_OTHER): Payer: BC Managed Care – PPO | Admitting: Internal Medicine

## 2012-05-11 VITALS — BP 124/86 | HR 76 | Ht 65.25 in | Wt 271.2 lb

## 2012-05-11 DIAGNOSIS — R1011 Right upper quadrant pain: Secondary | ICD-10-CM

## 2012-05-11 DIAGNOSIS — R1013 Epigastric pain: Secondary | ICD-10-CM

## 2012-05-11 DIAGNOSIS — R110 Nausea: Secondary | ICD-10-CM

## 2012-05-11 MED ORDER — ONDANSETRON HCL 4 MG PO TABS: 4.0000 mg | ORAL_TABLET | ORAL | Status: DC | PRN

## 2012-05-11 MED ORDER — HYOSCYAMINE SULFATE 0.125 MG SL SUBL: 0.1250 mg | SUBLINGUAL_TABLET | SUBLINGUAL | Status: DC | PRN

## 2012-05-11 NOTE — Progress Notes (Signed)
Patient ID: Autumn [name], female   DOB: 1965-11-10, 47 y.o.   MRN: [id number] HPI: Autumn [name] is a 47 yo female with PMH of hypothyroidism, diverticulosis with history of diverticulitis, gallstones status post cholecystectomy who seen in consultation at the request of Mauricio Po, FNP for evaluation of epigastric abdominal pain and nausea.  The patient reports ongoing issues with epigastric and right-sided abdominal pain. The pain is in her right middle and right upper quadrant. She reports it feels like a cramping or spasm-type pain. It often wakes her up at night. It is associated with nausea but no vomiting. She's not having heartburn. She has normal swallowing. Her bowel movements have been normal with no blood or melena. She denies diarrhea. She's had diverticulitis before in her current pain is not consistent with this. She has had a colonoscopy before in 2012 which you were poor it was "fine". He has tried Zofran which seemed to help the nausea. She was empirically started on metoclopramide about 2 weeks ago which she has taken but can't tell any difference. She's had her gallbladder removed for a different type pain and nausea. When the pain occurs it can last anywhere from minutes to an hour.  No f/c. She was recently hospitalized with atypical chest pain and ruled out for myocardial infarction. She had an echocardiogram which she reports was normal (on review of the records EF was greater than 55, there was mild left atrial enlargement otherwise unremarkable).  She has not had any further chest pain and no dyspnea.  Past Medical History  Diagnosis Date  . Hypothyroidism   . Diverticulosis   . Arthritis   . Gallstones   . Obesity     Past Surgical History  Procedure Laterality Date  . Cholecystectomy    . Laparoscopic total hysterectomy      Current Outpatient Prescriptions  Medication Sig Dispense Refill  . aspirin EC 81 MG tablet Take 81 mg by mouth daily.      Marland Kitchen levothyroxine  (SYNTHROID, LEVOTHROID) 75 MCG tablet Take 1 tablet (75 mcg total) by mouth daily before breakfast.  30 tablet  1  . ondansetron (ZOFRAN) 4 MG tablet Take 1 tablet (4 mg total) by mouth as needed for nausea.  20 tablet  1  . hyoscyamine (LEVSIN/SL) 0.125 MG SL tablet Place 1 tablet (0.125 mg total) under the tongue every 4 (four) hours as needed for cramping.  30 tablet  1   No current facility-administered medications for this visit.    No Known Allergies  Family History  Problem Relation Age of Onset  . Hypertension Mother   . Diabetes Mother   . Lymphoma Mother   . Lung cancer Father   . Pancreatic cancer Paternal Aunt   . Cancer Paternal Aunt     unknown type  . Thyroid disease Mother     History   Social History  . Marital Status: Married    Spouse Name: N/A    Number of Children: 1  . Years of Education: N/A   Occupational History  . ASSOCIATE [employer]   Social History Main Topics  . Smoking status: Never Smoker   . Smokeless tobacco: Never Used  . Alcohol Use: No  . Drug Use: No  . Sexually Active: None   Other Topics Concern  . None   Social History Narrative  . None    ROS: As per history of present illness, otherwise negative  BP 124/86  Pulse 76  Ht 5' 5.25" (  1.657 m)  Wt 271 lb 4 oz (123.038 kg)  BMI 44.81 kg/m2 Constitutional: Well-developed and well-nourished. No distress. HEENT: Normocephalic and atraumatic. Oropharynx is clear and moist. No oropharyngeal exudate. Conjunctivae are normal.  No scleral icterus. Neck: Neck supple. Trachea midline. Cardiovascular: Normal rate, regular rhythm and intact distal pulses. No M/R/G Pulmonary/chest: Effort normal and breath sounds normal. No wheezing, rales or rhonchi. Abdominal: Soft, epigastric and right upper quadrant tenderness without rebound or guarding, nondistended. Bowel sounds active throughout.  Extremities: no clubbing, cyanosis, or edema Lymphadenopathy: No cervical adenopathy  noted. Neurological: Alert and oriented to person place and time. Skin: Skin is warm and dry. No rashes noted. Psychiatric: Normal mood and affect. Behavior is normal.  RELEVANT LABS AND IMAGING: CBC    Component Value Date/Time   WBC 9.3 04/16/2012 0519   RBC 4.98 04/16/2012 0519   HGB 14.5 04/16/2012 0519   HCT 43.6 04/16/2012 0519   PLT 258 04/16/2012 0519   MCV 87.6 04/16/2012 0519   MCH 29.1 04/16/2012 0519   MCHC 33.3 04/16/2012 0519   RDW 13.1 04/16/2012 0519   LYMPHSABS 2.9 04/15/2012 1836   MONOABS 0.8 04/15/2012 1836   EOSABS 0.4 04/15/2012 1836   BASOSABS 0.1 04/15/2012 1836    CMP     Component Value Date/Time   NA 139 04/16/2012 0519   K 3.8 04/16/2012 0519   CL 104 04/16/2012 0519   CO2 27 04/16/2012 0519   GLUCOSE 107* 04/16/2012 0519   BUN 11 04/16/2012 0519   CREATININE 0.78 04/16/2012 0519   CALCIUM 9.1 04/16/2012 0519   PROT 6.7 04/16/2012 0519   ALBUMIN 3.2* 04/16/2012 0519   AST 24 04/16/2012 0519   ALT 23 04/16/2012 0519   ALKPHOS 79 04/16/2012 0519   BILITOT 0.3 04/16/2012 0519   GFRNONAA >90 04/16/2012 0519   GFRAA >90 04/16/2012 0519   Lipase     Component Value Date/Time   LIPASE 27 04/16/2012 0519    ASSESSMENT/PLAN: 46 yo female with PMH of hypothyroidism, diverticulosis with history of diverticulitis, gallstones status post cholecystectomy who seen in consultation at the request of Mauricio Po, FNP for evaluation of epigastric abdominal pain and nausea.  1.  Epigastric/RUQ pain -- recent lab work is reassuring and normal LFTs. Given her epigastric pain I have recommended upper endoscopy for further evaluation. We discussed the test today including the risks and benefits and she is agreeable to proceed. We will discontinue Reglan if she is not benefiting from it. She can continue the Zofran on an as-needed basis. I have given her prescription for Levsin for spasm pain to be used as needed and as directed.  If the upper endoscopy is unremarkable, I would recommend cross-sectional imaging.  2.   Hx of diverticulitis -- I would like to get a copy of her prior colonoscopy for completeness. We will request this from Dr. Chales Abrahams

## 2012-05-11 NOTE — Patient Instructions (Addendum)
You have been scheduled for an endoscopy with propofol. Please follow written instructions given to you at your visit today. If you use inhalers (even only as needed), please bring them with you on the day of your procedure.  We have sent the following medications to your pharmacy for you to pick up at your convenience: Zofran as needed; Levsin for abdominal spasm                                               We are excited to introduce MyChart, a new best-in-class service that provides you online access to important information in your electronic medical record. We want to make it easier for you to view your health information - all in one secure location - when and where you need it. We expect MyChart will enhance the quality of care and service we provide.  When you register for MyChart, you can:    View your test results.    Request appointments and receive appointment reminders via email.    Request medication renewals.    View your medical history, allergies, medications and immunizations.    Communicate with your physician's office through a password-protected site.    Conveniently print information such as your medication lists.  To find out if MyChart is right for you, please talk to a member of our clinical staff today. We will gladly answer your questions about this free health and wellness tool.  If you are age 48 or older and want a member of your family to have access to your record, you must provide written consent by completing a proxy form available at our office. Please speak to our clinical staff about guidelines regarding accounts for patients younger than age 11.  As you activate your MyChart account and need any technical assistance, please call the MyChart technical support line at [phone]-CHART [phone]) or email your question to [email or web address]. If you email your question(s), please include your name, a return phone number and the best time to reach  you.  If you have non-urgent health-related questions, you can send a message to our office through MyChart at [email or web address]. If you have a medical emergency, call 911.  Thank you for using MyChart as your new health and wellness resource!   MyChart licensed from [employer],  [id number]. Patents Pending.

## 2012-05-12 ENCOUNTER — Encounter: Payer: Self-pay | Admitting: Internal Medicine

## 2012-05-22 ENCOUNTER — Encounter: Payer: Self-pay | Admitting: Internal Medicine

## 2012-05-22 ENCOUNTER — Ambulatory Visit (AMBULATORY_SURGERY_CENTER): Payer: BC Managed Care – PPO | Admitting: Internal Medicine

## 2012-05-22 VITALS — BP 114/59 | HR 55 | Temp 96.4°F | Resp 18 | Ht 65.25 in | Wt 271.0 lb

## 2012-05-22 DIAGNOSIS — R1013 Epigastric pain: Secondary | ICD-10-CM

## 2012-05-22 DIAGNOSIS — K2970 Gastritis, unspecified, without bleeding: Secondary | ICD-10-CM

## 2012-05-22 DIAGNOSIS — R110 Nausea: Secondary | ICD-10-CM

## 2012-05-22 DIAGNOSIS — K2990 Gastroduodenitis, unspecified, without bleeding: Secondary | ICD-10-CM

## 2012-05-22 MED ORDER — PANTOPRAZOLE SODIUM 40 MG PO TBEC: 40.0000 mg | DELAYED_RELEASE_TABLET | Freq: Every day | ORAL | Status: DC

## 2012-05-22 MED ORDER — SODIUM CHLORIDE 0.9 % IV SOLN: 500.0000 mL | INTRAVENOUS | Status: DC

## 2012-05-22 NOTE — Op Note (Signed)
[facility] [address] [place], [address]   ENDOSCOPY PROCEDURE REPORT  PATIENT: Autumn [name], Autumn [name]  MR#: [id number] BIRTHDATE: [birth year] , 47  yrs. old GENDER: Female ENDOSCOPIST: Beverley Fiedler, MD REFERRED BY:  Mauricio Po PROCEDURE DATE:  05/22/2012 PROCEDURE:  EGD w/ biopsy for H.pylori ASA CLASS:     Class II INDICATIONS:  Epigastric pain.   Nausea. MEDICATIONS: MAC sedation, administered by CRNA and Propofol (Diprivan) 160 mg IV TOPICAL ANESTHETIC: Cetacaine Spray  DESCRIPTION OF PROCEDURE: After the risks benefits and alternatives of the procedure were thoroughly explained, informed consent was obtained.  The [facility]-GIF Q180 [id number] endoscope was introduced through the mouth and advanced to the second portion of the duodenum. Without limitations.  The instrument was slowly withdrawn as the mucosa was fully examined.    ESOPHAGUS: The mucosa of the esophagus appeared normal.   A normal Z-line was observed 40 cm from the incisors.  STOMACH: Mild gastropathy (erythema) was found in the gastric antrum.  Biopsies were taken in the antrum and angularis. Otherwise normal stomach.  DUODENUM: The duodenal mucosa showed no abnormalities in the bulb and second portion of the duodenum.         The scope was then withdrawn from the patient and the procedure completed.  COMPLICATIONS: There were no complications. ENDOSCOPIC IMPRESSION: 1.   The mucosa of the esophagus appeared normal 2.   Normal Z-line was observed 40 cm from the incisors 3.   Gastropathy was found in the gastric antrum; biopsies were taken in the antrum and angularis 4.   The duodenal mucosa showed no abnormalities in the bulb and second portion of the duodenum  RECOMMENDATIONS: 1.  Await pathology results 2.  Trial of pantoprazole 40 mg once daily (best taken 30 minutes before breakfast) 3.  Follow-up of helicobacter pylori status, treat if indicated 4.  Office follow-up in about 1 month.   Please call if symptoms do not improve before follow-up.  eSigned:  Beverley Fiedler, MD 05/22/2012 8:50 AM CC:The Patient

## 2012-05-22 NOTE — Patient Instructions (Signed)

## 2012-05-22 NOTE — Progress Notes (Signed)
Patient did not experience any of the following events: a burn prior to discharge; a fall within the facility; wrong site/side/patient/procedure/implant event; or a hospital transfer or hospital admission upon discharge from the facility. (G8907) Patient did not have preoperative order for IV antibiotic SSI prophylaxis. (G8918)  

## 2012-05-22 NOTE — Progress Notes (Signed)
Called to room to assist during endoscopic procedure.  Patient ID and intended procedure confirmed with present staff. Received instructions for my participation in the procedure from the performing physician.  

## 2012-05-23 ENCOUNTER — Telehealth: Payer: Self-pay

## 2012-05-23 NOTE — Telephone Encounter (Signed)
  Follow up Call-  Call back number 05/22/2012  Post procedure Call Back phone  # [phone]  Permission to leave phone message Yes     Patient questions:  Do you have a fever, pain , or abdominal swelling? no Pain Score  0 *  Have you tolerated food without any problems? yes  Have you been able to return to your normal activities? yes  Do you have any questions about your discharge instructions: Diet   no Medications  no Follow up visit  no  Do you have questions or concerns about your Care? no  Actions: * If pain score is 4 or above: No action needed, pain <4.

## 2012-05-30 ENCOUNTER — Other Ambulatory Visit: Payer: Self-pay | Admitting: *Deleted

## 2012-06-07 ENCOUNTER — Telehealth: Payer: Self-pay | Admitting: Internal Medicine

## 2012-06-07 ENCOUNTER — Encounter: Payer: Self-pay | Admitting: Internal Medicine

## 2012-06-07 NOTE — Telephone Encounter (Signed)
Pt reports she never got a path letter from her; went over the results and informed her Dr Rhea Belton will dictate her a letter. She did receive my letter about her May appt. She states the levsin did not help, she feels her problems may be gynecological in nature. Reports she had a hysterectomy with bladder tacking and now when she walks she feels like her insides are coming out. She asked for [facility] services and I gave her the names of [facility] affiliated mds.  She does report improvement with pantoprazole. Dr Rhea Belton, please f/u with path letter. Thanks.

## 2012-06-07 NOTE — Telephone Encounter (Signed)
Tell her I apologize a pathology letter was not sent in timely fashion Biopsies were benign with no evidence of H. pylori

## 2012-06-08 NOTE — Telephone Encounter (Signed)
Mailed pt's letter

## 2012-06-09 ENCOUNTER — Encounter: Payer: Self-pay | Admitting: *Deleted

## 2012-06-20 ENCOUNTER — Encounter (HOSPITAL_COMMUNITY): Payer: Self-pay | Admitting: Emergency Medicine

## 2012-06-20 ENCOUNTER — Emergency Department (HOSPITAL_COMMUNITY)
Admission: EM | Admit: 2012-06-20 | Discharge: 2012-06-20 | Disposition: A | Discharge status: Home / Self Care | Payer: BC Managed Care – PPO | Attending: Emergency Medicine | Admitting: Emergency Medicine

## 2012-06-20 ENCOUNTER — Encounter: Payer: Self-pay | Admitting: Internal Medicine

## 2012-06-20 DIAGNOSIS — Z8719 Personal history of other diseases of the digestive system: Secondary | ICD-10-CM | POA: Insufficient documentation

## 2012-06-20 DIAGNOSIS — Z79899 Other long term (current) drug therapy: Secondary | ICD-10-CM | POA: Insufficient documentation

## 2012-06-20 DIAGNOSIS — Z8739 Personal history of other diseases of the musculoskeletal system and connective tissue: Secondary | ICD-10-CM | POA: Insufficient documentation

## 2012-06-20 DIAGNOSIS — A499 Bacterial infection, unspecified: Secondary | ICD-10-CM | POA: Insufficient documentation

## 2012-06-20 DIAGNOSIS — IMO0002 Reserved for concepts with insufficient information to code with codable children: Secondary | ICD-10-CM

## 2012-06-20 DIAGNOSIS — Z7982 Long term (current) use of aspirin: Secondary | ICD-10-CM | POA: Insufficient documentation

## 2012-06-20 DIAGNOSIS — B9689 Other specified bacterial agents as the cause of diseases classified elsewhere: Secondary | ICD-10-CM | POA: Insufficient documentation

## 2012-06-20 DIAGNOSIS — E039 Hypothyroidism, unspecified: Secondary | ICD-10-CM | POA: Insufficient documentation

## 2012-06-20 DIAGNOSIS — N8111 Cystocele, midline: Secondary | ICD-10-CM | POA: Insufficient documentation

## 2012-06-20 DIAGNOSIS — E669 Obesity, unspecified: Secondary | ICD-10-CM | POA: Insufficient documentation

## 2012-06-20 DIAGNOSIS — N760 Acute vaginitis: Secondary | ICD-10-CM | POA: Insufficient documentation

## 2012-06-20 LAB — CBC WITH DIFFERENTIAL/PLATELET
Basophils Absolute: 0.1 10*3/uL (ref 0.0–0.1)
Eosinophils Relative: 3 % (ref 0–5)
HCT: 46.2 % — ABNORMAL HIGH (ref 36.0–46.0)
Lymphocytes Relative: 31 % (ref 12–46)
Lymphs Abs: 3.2 10*3/uL (ref 0.7–4.0)
MCV: 83.5 fL (ref 78.0–100.0)
Neutro Abs: 6 10*3/uL (ref 1.7–7.7)
Platelets: 287 10*3/uL (ref 150–400)
RBC: 5.53 MIL/uL — ABNORMAL HIGH (ref 3.87–5.11)
WBC: 10.3 10*3/uL (ref 4.0–10.5)

## 2012-06-20 LAB — LIPASE, BLOOD: Lipase: 21 U/L (ref 11–59)

## 2012-06-20 LAB — URINE MICROSCOPIC-ADD ON

## 2012-06-20 LAB — COMPREHENSIVE METABOLIC PANEL
Albumin: 4.1 g/dL (ref 3.5–5.2)
BUN: 8 mg/dL (ref 6–23)
Creatinine, Ser: 0.76 mg/dL (ref 0.50–1.10)
GFR calc Af Amer: >90 mL/min (ref >90)
Glucose, Bld: 114 mg/dL — ABNORMAL HIGH (ref 70–99)
Total Bilirubin: 0.3 mg/dL (ref 0.3–1.2)
Total Protein: 8.3 g/dL (ref 6.0–8.3)

## 2012-06-20 LAB — URINALYSIS, ROUTINE W REFLEX MICROSCOPIC
Bilirubin Urine: NEGATIVE
Nitrite: NEGATIVE
Specific Gravity, Urine: 1.018 (ref 1.005–1.030)
Urobilinogen, UA: 0.2 mg/dL (ref 0.0–1.0)
pH: 5 (ref 5.0–8.0)

## 2012-06-20 LAB — WET PREP, GENITAL
Trich, Wet Prep: NONE SEEN
Yeast Wet Prep HPF POC: NONE SEEN

## 2012-06-20 MED ORDER — METRONIDAZOLE 0.75 % VA GEL: 1.0000 | Freq: Two times a day (BID) | VAGINAL | Status: DC

## 2012-06-20 MED ORDER — SODIUM CHLORIDE 0.9 % IV BOLUS (SEPSIS): 1000.0000 mL | Freq: Once | INTRAVENOUS | Status: DC

## 2012-06-20 MED ORDER — OXYCODONE-ACETAMINOPHEN 5-325 MG PO TABS: 1.0000 | ORAL_TABLET | Freq: Four times a day (QID) | ORAL | Status: DC | PRN

## 2012-06-20 MED ORDER — OXYCODONE-ACETAMINOPHEN 5-325 MG PO TABS
2.0000 | ORAL_TABLET | Freq: Once | ORAL | Status: AC
  Administered 2012-06-20: 2 via ORAL
  Filled 2012-06-20: qty 2

## 2012-06-20 NOTE — ED Notes (Signed)
NURSE FIRST ROUNDS" PT. SITTING AT [facility] AREA WITH NO DISTRESS , RESPIRATIONS UNLABORED , WATCHING TV , DENIES PAIN . PHLEBOTOMIST PAGED FOR BLOOD COLLECTION .

## 2012-06-20 NOTE — ED Notes (Signed)
Pt c/o RLQ pain x 3 days.

## 2012-06-20 NOTE — ED Provider Notes (Signed)
History     CSN: [id number]  Arrival date & time 06/20/12  1712   First MD Initiated Contact with Patient 06/20/12 2019      Chief Complaint  Patient presents with  . Abdominal Pain    (Consider location/radiation/quality/duration/timing/severity/associated sxs/prior treatment) HPI Comments: Patient is a 47 year old female with a history of obesity, cholecystectomy, and laparoscopic total hysterectomy who presents for right suprapubic pain x3 days. Patient states that pain is a constant ache with intermittent sharp pain sensation. Patient denies radiation of the pain and states that pain is worse with standing, as she gets a sensation of vaginal fullness, and improved when lying flat and flexing her knees. She also states that she has had a sensation of incomplete bladder emptying. Patient denies fevers, chest pain, shortness of breath, nausea or vomiting, diarrhea or constipation, melena or hematochezia, vaginal discharge, urinary symptoms, and numbness or tingling in her extremities. Patient endorses a history of bladder tack within the past year.  Patient is a 47 y.o. female presenting with abdominal pain. The history is provided by the patient. No language interpreter was used.  Abdominal Pain Associated symptoms: no chest pain, no diarrhea, no fever, no nausea, no shortness of breath and no vomiting     Past Medical History  Diagnosis Date  . Hypothyroidism   . Diverticulosis   . Arthritis   . Gallstones   . Obesity     Past Surgical History  Procedure Laterality Date  . Cholecystectomy    . Laparoscopic total hysterectomy      Family History  Problem Relation Age of Onset  . Hypertension Mother   . Diabetes Mother   . Lymphoma Mother   . Thyroid disease Mother   . Lung cancer Father   . Pancreatic cancer Paternal Aunt   . Cancer Paternal Aunt     unknown type  . Colon cancer Neg Hx     History  Substance Use Topics  . Smoking status: Never Smoker   .  Smokeless tobacco: Never Used  . Alcohol Use: No    OB History   Grav Para Term Preterm Abortions TAB SAB Ect Mult Living                  Review of Systems  Constitutional: Negative for fever.  Respiratory: Negative for shortness of breath.   Cardiovascular: Negative for chest pain.  Gastrointestinal: Positive for abdominal pain. Negative for nausea, vomiting and diarrhea.  All other systems reviewed and are negative.    Allergies  Review of patient's allergies indicates no known allergies.  Home Medications   Current Outpatient Rx  Name  Route  Sig  Dispense  Refill  . aspirin EC 81 MG tablet   Oral   Take 81 mg by mouth daily.         . hyoscyamine (LEVSIN SL) 0.125 MG SL tablet   Sublingual   Place 0.125 mg under the tongue every 4 (four) hours as needed for cramping. For cramping         . levothyroxine (SYNTHROID, LEVOTHROID) 75 MCG tablet   Oral   Take 1 tablet (75 mcg total) by mouth daily before breakfast.   30 tablet   1   . ondansetron (ZOFRAN) 4 MG tablet   Oral   Take 1 tablet (4 mg total) by mouth as needed for nausea.   20 tablet   1   . pantoprazole (PROTONIX) 40 MG tablet   Oral   Take  1 tablet (40 mg total) by mouth daily.   30 tablet   11   . metroNIDAZOLE (METROGEL VAGINAL) 0.75 % vaginal gel   Vaginal   Place 1 Applicatorful vaginally 2 (two) times daily.   70 g   0   . oxyCODONE-acetaminophen (PERCOCET/ROXICET) 5-325 MG per tablet   Oral   Take 1-2 tablets by mouth every 6 (six) hours as needed for pain.   10 tablet   0     BP 117/69  Pulse 62  Temp(Src) 97.8 F (36.6 C) (Oral)  Resp 16  SpO2 93%  Physical Exam  Nursing note and vitals reviewed. Constitutional: She is oriented to person, place, and time. She appears well-developed and well-nourished. No distress.  HENT:  Head: Normocephalic and atraumatic.  Mouth/Throat: Oropharynx is clear and moist. No oropharyngeal exudate.  Eyes: Conjunctivae and EOM are  normal. Pupils are equal, round, and reactive to light. No scleral icterus.  Neck: Normal range of motion. Neck supple.  Cardiovascular: Normal rate, regular rhythm, normal heart sounds and intact distal pulses.   Pulmonary/Chest: Effort normal and breath sounds normal. No respiratory distress. She has no wheezes. She has no rales. She exhibits no tenderness.  Abdominal: Soft. Bowel sounds are normal. She exhibits no mass. There is tenderness in the suprapubic area. There is no rigidity, no rebound, no guarding, no CVA tenderness, no tenderness at McBurney's point and negative Murphy's sign.    Soft, obese abdomen. Tenderness to palpation in the right suprapubic region without peritoneal signs or guarding.  Genitourinary: There is no rash, tenderness, lesion or injury on the right labia. There is no rash, tenderness, lesion or injury on the left labia. There is tenderness around the vagina. No bleeding around the vagina. No foreign body around the vagina. No signs of injury around the vagina. No vaginal discharge found.  Grade 1 (possibly early grade 2) cystocele appreciated on visual examination of the vagina with patient supine. Tenderness on palpation of the superior wall of the vagina. No other abnormalities noted.  Musculoskeletal: Normal range of motion.  Lymphadenopathy:    She has no cervical adenopathy.  Neurological: She is alert and oriented to person, place, and time.  Skin: Skin is warm and dry. No rash noted. She is not diaphoretic. No erythema. No pallor.  Psychiatric: She has a normal mood and affect. Her behavior is normal.    ED Course  Procedures (including critical care time)  Labs Reviewed  WET PREP, GENITAL - Abnormal; Notable for the following:    Clue Cells Wet Prep HPF POC MANY (*)    All other components within normal limits  COMPREHENSIVE METABOLIC PANEL - Abnormal; Notable for the following:    Glucose, Bld 114 (*)    All other components within normal limits    CBC WITH DIFFERENTIAL - Abnormal; Notable for the following:    RBC 5.53 (*)    Hemoglobin 16.4 (*)    HCT 46.2 (*)    All other components within normal limits  URINALYSIS, ROUTINE W REFLEX MICROSCOPIC - Abnormal; Notable for the following:    APPearance HAZY (*)    Leukocytes, UA SMALL (*)    All other components within normal limits  URINE MICROSCOPIC-ADD ON - Abnormal; Notable for the following:    Squamous Epithelial / LPF FEW (*)    Bacteria, UA FEW (*)    Casts HYALINE CASTS (*)    All other components within normal limits  URINE CULTURE  LIPASE, BLOOD  URINALYSIS, MICROSCOPIC ONLY   No results found.   1. Cystocele   2. Bacterial vaginosis     MDM  Uncomplicated cystocele. Patient with right suprapubic pain x3 days with associated vaginal fullness upon standing. Grade 1, possibly grade 2, cystocele appreciated on pelvic exam with tenderness on palpation of the superior wall of the vaginal canal. Wet prep significant for many clue cells.   Urinalysis without evidence of infection. There is no leukocytosis or anemia. Patient's kidney function preserved and liver function normal. No fever, N/V, or tenderness at McBurney's point to suggest appendicitis. Patient is well and nontoxic appearing, hemodynamically stable. Appropriate for discharge with OB/GYN followup as well as MetroGel to apply for bacterial vaginosis. Indications for ED return provided. Patient states comfort and understanding with this discharge plan with no unaddressed concerns. Patient work up and management discussed with Dr. Effie Shy who is in agreement.        Antony Madura, [place] 06/24/12 [phone]

## 2012-06-21 LAB — URINE CULTURE: Colony Count: >=100000

## 2012-06-23 ENCOUNTER — Encounter: Payer: Self-pay | Admitting: Internal Medicine

## 2012-06-23 ENCOUNTER — Ambulatory Visit (INDEPENDENT_AMBULATORY_CARE_PROVIDER_SITE_OTHER): Payer: BC Managed Care – PPO | Admitting: Internal Medicine

## 2012-06-23 VITALS — BP 122/80 | HR 60 | Ht 65.0 in | Wt 271.1 lb

## 2012-06-23 DIAGNOSIS — R1031 Right lower quadrant pain: Secondary | ICD-10-CM

## 2012-06-23 DIAGNOSIS — Z860101 Personal history of adenomatous and serrated colon polyps: Secondary | ICD-10-CM

## 2012-06-23 DIAGNOSIS — Z86010 Personal history of colonic polyps: Secondary | ICD-10-CM

## 2012-06-23 DIAGNOSIS — R1013 Epigastric pain: Secondary | ICD-10-CM

## 2012-06-23 HISTORY — DX: Personal history of colonic polyps: Z86.010

## 2012-06-23 HISTORY — DX: Personal history of adenomatous and serrated colon polyps: Z86.0101

## 2012-06-23 NOTE — Patient Instructions (Addendum)
You have been scheduled for a CT scan of the abdomen and pelvis at [facility] ([address]---this is in the same building as [facility]).   You are scheduled on 06/28/2012 at 3:00. You should arrive 15 minutes prior to your appointment time for registration. Please follow the written instructions below on the day of your exam:  WARNING: IF YOU ARE ALLERGIC TO IODINE/X-RAY DYE, PLEASE NOTIFY RADIOLOGY IMMEDIATELY AT [phone]! YOU WILL BE GIVEN A 13 HOUR PREMEDICATION PREP.  1) Do not eat or drink anything after 11:00am (4 hours prior to your test) 2) You have been given 2 bottles of oral contrast to drink. The solution may taste better if refrigerated, but do NOT add ice or any other liquid to this solution. Shake well before drinking.    Drink 1 bottle of contrast @ 1:00 (2 hours prior to your exam)  Drink 1 bottle of contrast @ 2:00 (1 hour prior to your exam)  You may take any medications as prescribed with a small amount of water except for the following: Metformin, Glucophage, Glucovance, Avandamet, Riomet, Fortamet, Actoplus Met, Janumet, Glumetza or Metaglip. The above medications must be held the day of the exam AND 48 hours after the exam.  The purpose of you drinking the oral contrast is to aid in the visualization of your intestinal tract. The contrast solution may cause some diarrhea. Before your exam is started, you will be given a small amount of fluid to drink. Depending on your individual set of symptoms, you may also receive an intravenous injection of x-ray contrast/dye. Plan on being at [facility] for 30 minutes or long, depending on the type of exam you are having performed.                                                 We are excited to introduce MyChart, a new best-in-class service that provides you online access to important information in your electronic medical record. We want to make it easier for you to view your health information  - all in one secure location - when and where you need it. We expect MyChart will enhance the quality of care and service we provide.  When you register for MyChart, you can:    View your test results.    Request appointments and receive appointment reminders via email.    Request medication renewals.    View your medical history, allergies, medications and immunizations.    Communicate with your physician's office through a password-protected site.    Conveniently print information such as your medication lists.  To find out if MyChart is right for you, please talk to a member of our clinical staff today. We will gladly answer your questions about this free health and wellness tool.  If you are age 60 or older and want a member of your family to have access to your record, you must provide written consent by completing a proxy form available at our office. Please speak to our clinical staff about guidelines regarding accounts for patients younger than age 72.  As you activate your MyChart account and need any technical assistance, please call the MyChart technical support line at [phone]) or email your question to [email or web address]. If you email your question(s), please include your name, a return phone number and the best  time to reach you.  If you have non-urgent health-related questions, you can send a message to our office through MyChart at [email or web address]. If you have a medical emergency, call 911.  Thank you for using MyChart as your new health and wellness resource!   MyChart licensed from [employer],  [id number]. Patents Pending.   If you have any questions regarding your exam or if you need to reschedule, you may call the CT department at [phone] between the hours of 8:00 am and 5:00 pm, Monday-Friday.  ________________________________________________________________________

## 2012-06-23 NOTE — Progress Notes (Signed)
Subjective:    Patient ID: Autumn [name], female    DOB: 03/19/1965, 47 y.o.   MRN: [id number]  HPI Autumn [name] is a 47 year old female with a past medical history of hypothyroidism, diverticulosis with history of diverticulitis, gallstones status post cholecystectomy who is seen in followup. She was last seen in late March for epigastric abdominal pain and nausea and had an upper endoscopy performed on 05/22/2012. This showed a normal-appearing esophagus, mild gastropathy, and a normal-appearing duodenum. Biopsies showed mild chronic inactive gastritis without H. Pylori.  She was placed on a trial pantoprazole 40 mg daily. She reports is significantly improved her epigastric abdominal pain and nausea. She's had no further trouble with nausea. She does occasionally have upper abdominal cramping type pain which is relieved by Levsin. Since Saturday she has been troubled by right lower quadrant abdominal pain which radiates towards her vagina. She was seen in the ER over the weekend and was told that she may have a cystocele (she has previous history of bladder tack) and was diagnosed with bacterial vaginosis and given metronidazole vaginal gel.  She reports her pain has improved, but has not completely gone. Over the weekend it was 10 over 10. It does not seem to relate to eating or bowel movement. He does not feel like diverticulitis to her. She reports the pain as a sharp pain radiating towards her vagina. It was worse with movement and standing. She was having a sensation of vaginal pressure. She was seen by a gynecologist in [place] earlier this week, and was told her vaginal exam was normal with no evidence of specific pathology. She has previously had a hysterectomy.  Reports a good appetite without nausea or vomiting. Her bowel movements have been normal without diarrhea, constipation, blood in her stool or melena.  She is not aware of any fevers or chills. She was given a prescription for oxycodone  which helps with her right lower quadrant pain. Levsin did not benefit this pain at all  Review of Systems As per history of present illness, otherwise negative  Current Medications, Allergies, Past Medical History, Past Surgical History, Family History and Social History were reviewed in [employer] record.     Objective:   Physical Exam BP 122/80  Pulse 60  Ht 5\' 5"  (1.651 m)  Wt 271 lb 2 oz (122.981 kg)  BMI 45.12 kg/m2 Constitutional: Well-developed and well-nourished. No distress. HEENT: Normocephalic and atraumatic. Oropharynx is clear and moist. No oropharyngeal exudate. Poor dentition Conjunctivae are normal.  No scleral icterus. Neck: Neck supple. Trachea midline. Cardiovascular: Normal rate, regular rhythm and intact distal pulses.  Pulmonary/chest: Effort normal and breath sounds normal. No wheezing, rales or rhonchi. Abdominal: Soft, right lower quadrant tenderness to palpation without rebound or guarding, nondistended. Negative Carnett's sign, Bowel sounds active throughout. There are no masses palpable. Abdominal striae Extremities: no clubbing, cyanosis, or edema Lymphadenopathy: No cervical adenopathy noted. Neurological: Alert and oriented to person place and time. Skin: Skin is warm and dry. No rashes noted. Psychiatric: Normal mood and affect. Behavior is normal.  CBC    Component Value Date/Time   WBC 10.3 06/20/2012 1727   RBC 5.53* 06/20/2012 1727   HGB 16.4* 06/20/2012 1727   HCT 46.2* 06/20/2012 1727   PLT 287 06/20/2012 1727   MCV 83.5 06/20/2012 1727   MCH 29.7 06/20/2012 1727   MCHC 35.5 06/20/2012 1727   RDW 13.0 06/20/2012 1727   LYMPHSABS 3.2 06/20/2012 1727   MONOABS 0.7 06/20/2012 1727  EOSABS 0.3 06/20/2012 1727   BASOSABS 0.1 06/20/2012 1727    CMP     Component Value Date/Time   NA 141 06/20/2012 1727   K 4.3 06/20/2012 1727   CL 101 06/20/2012 1727   CO2 29 06/20/2012 1727   GLUCOSE 114* 06/20/2012 1727   BUN 8 06/20/2012 1727   CREATININE  0.76 06/20/2012 1727   CALCIUM 9.5 06/20/2012 1727   PROT 8.3 06/20/2012 1727   ALBUMIN 4.1 06/20/2012 1727   AST 30 06/20/2012 1727   ALT 28 06/20/2012 1727   ALKPHOS 97 06/20/2012 1727   BILITOT 0.3 06/20/2012 1727   GFRNONAA >90 06/20/2012 1727   GFRAA >90 06/20/2012 1727   Lipase     Component Value Date/Time   LIPASE 21 06/20/2012 1727        Assessment & Plan:  47 year old female with a past medical history of hypothyroidism, diverticulosis with history of diverticulitis, gallstones status post cholecystectomy who is seen in followup.  1.  Epigastric pain/dyspepsia -- approved with pantoprazole 40 mg daily. She will continue with this dose for now. There is no evidence for H. pylori on biopsy. She can also continue with Levsin as needed for her rare upper abdominal spasm-type pain.  2.  RLQ pain -- unclear cause for this pain, though it does not appear to be absolutely GI in etiology. I recommended a CT scan of the abdomen and pelvis with contrast to better evaluate this pain. I'm concerned about a possible hernia, but also would like to rule out diverticulitis, or smoldering appendicitis.  3.  History of adenomatous colon polyp -- March 2012, in [place] performed by Dr. Chales Abrahams.  Records received and will be scanned into chart.  1.2 cm ascending colon polyp = adenoma.  We contacted his office and they indicated his recommendation at that time was to repeat the exam in 3 years.  Based on the size of this polyp greater than 1 cm, and based on current guidelines, I recommend repeat colonoscopy in March 2012 surveillance/screening.  She is aware of this recommendation

## 2012-06-26 NOTE — ED Provider Notes (Signed)
Medical screening examination/treatment/procedure(s) were performed by non-physician practitioner and as supervising physician I was immediately available for consultation/collaboration.  Devaughn Savant L Rhealyn Cullen, MD 06/26/12 0712 

## 2012-06-28 ENCOUNTER — Ambulatory Visit (INDEPENDENT_AMBULATORY_CARE_PROVIDER_SITE_OTHER)
Admission: RE | Admit: 2012-06-28 | Discharge: 2012-06-28 | Disposition: A | Discharge status: Home / Self Care | Payer: BC Managed Care – PPO | Source: Ambulatory Visit | Attending: Internal Medicine | Admitting: Internal Medicine

## 2012-06-28 DIAGNOSIS — R1031 Right lower quadrant pain: Secondary | ICD-10-CM

## 2012-06-28 MED ORDER — IOHEXOL 300 MG/ML  SOLN
100.0000 mL | Freq: Once | INTRAMUSCULAR | Status: AC | PRN
Start: 2012-06-28 — End: 2012-06-28
  Administered 2012-06-28: 100 mL via INTRAVENOUS

## 2012-06-29 ENCOUNTER — Telehealth: Payer: Self-pay | Admitting: Internal Medicine

## 2012-06-30 NOTE — Telephone Encounter (Signed)
CT abdomen pelvis is stable revealing no cause for her right lower quadrant pain The CT is very reassuring, but again does not explain her pain Check on her to see if she is feeling any better at this point. Informed pt of CT results and she asked if the scan would reveal if her bladder had dropped. Explained the CT again and the bladder was not mentioned, but informed her, the bladder would be less full since she hadn't had anything to eat or drink except contrast.  She reports the pain is intermittent  And is worse when she moves around a lot.Will ask Dr Rhea Belton what the next step is. Pt stated understanding. Please advise. Thanks.

## 2012-07-04 NOTE — Telephone Encounter (Signed)
Pain radiating toward vagina could be bladder in nature, esp given history of bladder surgery. She reports she was recently seen by a new gynecologist in [facility] referral could be offered to evaluate possible bladder symptoms contributing to her pain radiating towards her vagina

## 2012-07-05 NOTE — Telephone Encounter (Signed)
Informed pt of Dr Lauro Franklin findings/recommendations. She would like a referral to Urology. Called and appt made with Dr Margarita Grizzle on 07/26/12 at 10:45am; will fax records to [phone]. Notified pt.

## 2012-07-05 NOTE — Telephone Encounter (Signed)
lmom for pt to call back

## 2012-12-05 ENCOUNTER — Telehealth: Payer: Self-pay | Admitting: *Deleted

## 2012-12-05 NOTE — Telephone Encounter (Signed)
lmom for pt to call back

## 2012-12-05 NOTE — Telephone Encounter (Signed)
Pt is scheduled for recall COLON March, 2015 per notes. Pt will lose insurance in December and wants to know if she can have her COLON done this year. She is having problems with pain in the RLQ of her abdomen, hemorrhoids and she has pain and burning with a BM. OK to schedule a Direct Colon? Thanks.

## 2012-12-05 NOTE — Telephone Encounter (Signed)
Yes okay for colonoscopy in December

## 2012-12-21 ENCOUNTER — Other Ambulatory Visit: Payer: Self-pay

## 2012-12-27 NOTE — Telephone Encounter (Signed)
This has been scheduled

## 2012-12-28 ENCOUNTER — Ambulatory Visit (AMBULATORY_SURGERY_CENTER): Payer: Self-pay

## 2012-12-28 ENCOUNTER — Encounter: Payer: Self-pay | Admitting: Internal Medicine

## 2012-12-28 VITALS — Ht 67.0 in | Wt 265.2 lb

## 2012-12-28 DIAGNOSIS — Z860100 Personal history of colon polyps, unspecified: Secondary | ICD-10-CM

## 2012-12-28 MED ORDER — MOVIPREP 100 G PO SOLR: 1.0000 | Freq: Once | ORAL | Status: DC

## 2013-01-16 ENCOUNTER — Telehealth: Payer: Self-pay | Admitting: Internal Medicine

## 2013-01-16 NOTE — Telephone Encounter (Signed)
Pt reports she starting hurting and cramping over the holiday and her abdomen was so tender she couldn't roll over in bed. Her husband took her to [facility] and they did a CT scan showing Diverticulitis and Cipro and Flagyl were ordered. Pt has her CT for 01/26/13; will she still be able to keep this appt? Thanks.

## 2013-01-16 NOTE — Telephone Encounter (Signed)
I would like her diverticulitis to be completely treated at the time of her colonoscopy. 01/26/2013 may work okay for her and we will keep the appointment for now, please have her tell us if her diverticulitis pain/symptoms or not 100% better by 01/24/2013 If pain/symptoms have not improved by then, we will likely need to slightly delay colonoscopy and perhaps extend the antibiotic course

## 2013-01-17 NOTE — Telephone Encounter (Signed)
lmom for pt to call back

## 2013-01-18 NOTE — Telephone Encounter (Signed)
Informed pt of Dr Lauro Franklin suggestions/recommendations. She states she is feeling a little better. She will call if she doesn't feel better by 01/24/13.

## 2013-01-26 ENCOUNTER — Ambulatory Visit (AMBULATORY_SURGERY_CENTER): Payer: BC Managed Care – PPO | Admitting: Internal Medicine

## 2013-01-26 ENCOUNTER — Encounter: Payer: Self-pay | Admitting: Internal Medicine

## 2013-01-26 VITALS — BP 124/73 | HR 44 | Temp 96.5°F | Resp 12 | Ht 67.0 in | Wt 265.0 lb

## 2013-01-26 DIAGNOSIS — Z86010 Personal history of colonic polyps: Secondary | ICD-10-CM

## 2013-01-26 DIAGNOSIS — Z1211 Encounter for screening for malignant neoplasm of colon: Secondary | ICD-10-CM

## 2013-01-26 DIAGNOSIS — D126 Benign neoplasm of colon, unspecified: Secondary | ICD-10-CM

## 2013-01-26 DIAGNOSIS — R6889 Other general symptoms and signs: Secondary | ICD-10-CM

## 2013-01-26 MED ORDER — SODIUM CHLORIDE 0.9 % IV SOLN: 500.0000 mL | INTRAVENOUS | Status: DC

## 2013-01-26 NOTE — Progress Notes (Signed)
Report to pacu rn, vss, bbs=clear 

## 2013-01-26 NOTE — Progress Notes (Signed)
No egg or soy allergy. ewm No problems with past sedation. emw 

## 2013-01-26 NOTE — Progress Notes (Signed)
Called to room to assist during endoscopic procedure.  Patient ID and intended procedure confirmed with present staff. Received instructions for my participation in the procedure from the performing physician.  

## 2013-01-26 NOTE — Patient Instructions (Signed)
Impressions/recommendations:  Polyps (handout given) Diverticulosis (handout given)  High Fiber Diet (handout given)  Repeat colonoscopy pending pathology results.  YOU HAD AN ENDOSCOPIC PROCEDURE TODAY AT THE [facility]: Refer to the procedure report that was given to you for any specific questions about what was found during the examination.  If the procedure report does not answer your questions, please call your gastroenterologist to clarify.  If you requested that your care partner not be given the details of your procedure findings, then the procedure report has been included in a sealed envelope for you to review at your convenience later.  YOU SHOULD EXPECT: Some feelings of bloating in the abdomen. Passage of more gas than usual.  Walking can help get rid of the air that was put into your GI tract during the procedure and reduce the bloating. If you had a lower endoscopy (such as a colonoscopy or flexible sigmoidoscopy) you may notice spotting of blood in your stool or on the toilet paper. If you underwent a bowel prep for your procedure, then you may not have a normal bowel movement for a few days.  DIET: Your first meal following the procedure should be a light meal and then it is ok to progress to your normal diet.  A half-sandwich or bowl of soup is an example of a good first meal.  Heavy or fried foods are harder to digest and may make you feel nauseous or bloated.  Likewise meals heavy in dairy and vegetables can cause extra gas to form and this can also increase the bloating.  Drink plenty of fluids but you should avoid alcoholic beverages for 24 hours.  ACTIVITY: Your care partner should take you home directly after the procedure.  You should plan to take it easy, moving slowly for the rest of the day.  You can resume normal activity the day after the procedure however you should NOT DRIVE or use heavy machinery for 24 hours (because of the sedation medicines used during  the test).    SYMPTOMS TO REPORT IMMEDIATELY: A gastroenterologist can be reached at any hour.  During normal business hours, 8:30 AM to 5:00 PM Monday through Friday, call [phone].  After hours and on weekends, please call the GI answering service at [phone] who will take a message and have the physician on call contact you.   Following lower endoscopy (colonoscopy or flexible sigmoidoscopy):  Excessive amounts of blood in the stool  Significant tenderness or worsening of abdominal pains  Swelling of the abdomen that is new, acute  Fever of 100F or higher  FOLLOW UP: If any biopsies were taken you will be contacted by phone or by letter within the next 1-3 weeks.  Call your gastroenterologist if you have not heard about the biopsies in 3 weeks.  Our staff will call the home number listed on your records the next business day following your procedure to check on you and address any questions or concerns that you may have at that time regarding the information given to you following your procedure. This is a courtesy call and so if there is no answer at the home number and we have not heard from you through the emergency physician on call, we will assume that you have returned to your regular daily activities without incident.  SIGNATURES/CONFIDENTIALITY: You and/or your care partner have signed paperwork which will be entered into your electronic medical record.  These signatures attest to the fact that that the  information above on your After Visit Summary has been reviewed and is understood.  Full responsibility of the confidentiality of this discharge information lies with you and/or your care-partner.

## 2013-01-26 NOTE — Op Note (Signed)
[facility] [address] [place], [address]   COLONOSCOPY PROCEDURE REPORT  PATIENT: [name], [name]  MR#: [id number] BIRTHDATE: [birth year] , 47  yrs. old GENDER: Female ENDOSCOPIST: Beverley Fiedler, MD PROCEDURE DATE:  01/26/2013 PROCEDURE: First Screening Colonoscopy - Avg.  risk and is 50 yrs.  old or older - No.  Prior Negative Screening - Now for repeat screening. N/A  History of Adenoma - Now for follow-up colonoscopy & has been > or = to 3 yrs.  No.  It has been less than 3 yrs since last colonoscopy.  Medical reason.  Polyps Removed Today? Yes. ASA CLASS:   Class III INDICATIONS:Patient's personal history of adenomatous colon polyps and elevated risk screening. MEDICATIONS: MAC sedation, administered by CRNA and Propofol (Diprivan) 430 mg IV  DESCRIPTION OF PROCEDURE:   After the risks benefits and alternatives of the procedure were thoroughly explained, informed consent was obtained.  A digital rectal exam revealed no rectal mass and small external hemorrhoids.   The LB [id number] endoscope was introduced through the anus and advanced to the terminal ileum which was intubated for a short distance. No adverse events experienced.   The quality of the prep was good, using MoviPrep  The instrument was then slowly withdrawn as the colon was fully examined.    COLON FINDINGS: The mucosa appeared normal in the terminal ileum. Possible abnormal mucosa vs. ileal mucosa was found at the ileocecal valve.  Multiple biopsies of the area were performed using cold forceps to exclude adenoma.   Three sessile polyps ranging between 3-28mm in size were found in the transverse colon and sigmoid colon.  Polypectomy was performed using cold snare. All resections were complete and all polyp tissue was completely retrieved.   There was mild scattered diverticulosis noted throughout the entire examined colon.  Retroflexed views revealed no abnormalities. The time to  cecum=2 minutes 58 seconds. Withdrawal time=20 minutes 50 seconds.  The scope was withdrawn and the procedure completed.  COMPLICATIONS: There were no complications.  ENDOSCOPIC IMPRESSION: 1.   Normal mucosa in the terminal ileum 2.   Possible abnormal mucosa was found at the ileocecal valve; multiple biopsies of the area were performed using cold forceps 3.   Three sessile polyps ranging between 3-63mm in size were found in the transverse colon and sigmoid colon; Polypectomy was performed using cold snare 4.   There was mild diverticulosis noted throughout the entire examined colon  RECOMMENDATIONS: 1.  Await pathology results 2.  High fiber diet 3.  Timing of repeat colonoscopy will be determined by pathology findings. 4.  You will receive a letter within 1-2 weeks with the results of your biopsy as well as final recommendations.  Please call my office if you have not received a letter after 3 weeks.   eSigned:  Beverley Fiedler, MD 01/26/2013 10:24 AM   cc: The Patient; [facility]   PATIENT NAME:  [name], [name] MR#: [id number]

## 2013-01-26 NOTE — Progress Notes (Signed)
Patient did not experience any of the following events: a burn prior to discharge; a fall within the facility; wrong site/side/patient/procedure/implant event; or a hospital transfer or hospital admission upon discharge from the facility. (G8907) Patient did not have preoperative order for IV antibiotic SSI prophylaxis. (G8918)  

## 2013-01-29 ENCOUNTER — Telehealth: Payer: Self-pay | Admitting: *Deleted

## 2013-01-29 NOTE — Telephone Encounter (Signed)
  Follow up Call-  Call back number 01/26/2013 05/22/2012  Post procedure Call Back phone  # [phone]  Permission to leave phone message Yes Yes     Patient questions:  Do you have a fever, pain , or abdominal swelling? no Pain Score  0 *  Have you tolerated food without any problems? yes  Have you been able to return to your normal activities? yes  Do you have any questions about your discharge instructions: Diet   no Medications  no Follow up visit  no  Do you have questions or concerns about your Care? no  Actions: * If pain score is 4 or above: No action needed, pain <4.

## 2013-01-30 ENCOUNTER — Encounter: Payer: Self-pay | Admitting: Internal Medicine

## 2013-08-23 ENCOUNTER — Encounter: Payer: Self-pay | Admitting: Interventional Cardiology

## 2015-11-26 ENCOUNTER — Encounter: Payer: Self-pay | Admitting: Internal Medicine

## 2016-05-29 ENCOUNTER — Emergency Department (HOSPITAL_COMMUNITY)
Admission: EM | Admit: 2016-05-29 | Discharge: 2016-05-29 | Disposition: A | Discharge status: Home / Self Care | Payer: BLUE CROSS/BLUE SHIELD | Attending: Emergency Medicine | Admitting: Emergency Medicine

## 2016-05-29 ENCOUNTER — Emergency Department (HOSPITAL_COMMUNITY): Payer: BLUE CROSS/BLUE SHIELD

## 2016-05-29 ENCOUNTER — Encounter (HOSPITAL_COMMUNITY): Payer: Self-pay | Admitting: Emergency Medicine

## 2016-05-29 DIAGNOSIS — E039 Hypothyroidism, unspecified: Secondary | ICD-10-CM | POA: Insufficient documentation

## 2016-05-29 DIAGNOSIS — M5412 Radiculopathy, cervical region: Secondary | ICD-10-CM | POA: Diagnosis not present

## 2016-05-29 DIAGNOSIS — Z79899 Other long term (current) drug therapy: Secondary | ICD-10-CM | POA: Insufficient documentation

## 2016-05-29 DIAGNOSIS — R0789 Other chest pain: Secondary | ICD-10-CM | POA: Diagnosis not present

## 2016-05-29 DIAGNOSIS — R079 Chest pain, unspecified: Secondary | ICD-10-CM | POA: Diagnosis present

## 2016-05-29 LAB — CBC
HEMATOCRIT: 46.7 % — AB (ref 36.0–46.0)
Hemoglobin: 15.4 g/dL — ABNORMAL HIGH (ref 12.0–15.0)
MCH: 29.4 pg (ref 26.0–34.0)
MCHC: 33 g/dL (ref 30.0–36.0)
MCV: 89.3 fL (ref 78.0–100.0)
Platelets: 246 10*3/uL (ref 150–400)
RBC: 5.23 MIL/uL — ABNORMAL HIGH (ref 3.87–5.11)
RDW: 13.1 % (ref 11.5–15.5)
WBC: 8.3 10*3/uL (ref 4.0–10.5)

## 2016-05-29 LAB — I-STAT TROPONIN, ED: Troponin i, poc: 0 ng/mL (ref 0.00–0.08)

## 2016-05-29 LAB — BASIC METABOLIC PANEL
Anion gap: 7 (ref 5–15)
BUN: 8 mg/dL (ref 6–20)
CO2: 25 mmol/L (ref 22–32)
Calcium: 9.1 mg/dL (ref 8.9–10.3)
Chloride: 108 mmol/L (ref 101–111)
Creatinine, Ser: 0.67 mg/dL (ref 0.44–1.00)
GFR calc Af Amer: >60 mL/min (ref >60)
GFR calc non Af Amer: >60 mL/min (ref >60)
GLUCOSE: 107 mg/dL — AB (ref 65–99)
POTASSIUM: 3.9 mmol/L (ref 3.5–5.1)
Sodium: 140 mmol/L (ref 135–145)

## 2016-05-29 LAB — TROPONIN I: Troponin I: <0.03 ng/mL (ref <0.03)

## 2016-05-29 LAB — TSH: TSH: 3.307 u[IU]/mL (ref 0.350–4.500)

## 2016-05-29 MED ORDER — PREDNISONE 10 MG (21) PO TBPK: ORAL_TABLET | ORAL | 0 refills | Status: DC

## 2016-05-29 NOTE — ED Provider Notes (Signed)
[facility] DEPT Provider Note   CSN: [id number] Arrival date & time: 05/29/16  1328     History   Chief Complaint Chief Complaint  Patient presents with  . Chest Pain    HPI Autumn [name] is a 51 y.o. female.  Pt presents to the ED today with chest pain on and off for about 1 year.  She said she feels like her arms are numb and her left arm "can't get comfortable."  The pt said most of her pain was in her right breast and it hurt to touch.  Pt denies any prior cardiac hx.      Past Medical History:  Diagnosis Date  . Arthritis   . Diverticulitis    last episode the sat before thanksgiving  . Diverticulosis   . Gallstones   . Hypothyroidism   . Obesity     Patient Active Problem List   Diagnosis Date Noted  . Hx of adenomatous colonic polyps 06/23/2012  . Chest pain on exertion 04/16/2012  . Morbid obesity with BMI of 40.0-44.9, adult ([place]) 04/16/2012  . Abdominal pain, acute, epigastric 04/16/2012    Past Surgical History:  Procedure Laterality Date  . bladder tack  2007  . CHOLECYSTECTOMY    . LAPAROSCOPIC TOTAL HYSTERECTOMY      OB History    No data available       Home Medications    Prior to Admission medications   Medication Sig Start Date End Date Taking? Authorizing Provider  acetaminophen (TYLENOL) 325 MG tablet Take 650 mg by mouth every 6 (six) hours as needed for mild pain.   Yes Historical Provider, MD  levothyroxine (SYNTHROID, LEVOTHROID) 75 MCG tablet Take 1 tablet (75 mcg total) by mouth daily before breakfast. Patient not taking: Reported on 05/29/2016 04/18/12   Caren Griffins, MD  ondansetron (ZOFRAN) 4 MG tablet Take 1 tablet (4 mg total) by mouth as needed for nausea. Patient not taking: Reported on 05/29/2016 05/11/12   Jerene Bears, MD  oxyCODONE-acetaminophen (PERCOCET/ROXICET) 5-325 MG per tablet Take 1-2 tablets by mouth every 6 (six) hours as needed for pain. Patient not taking: Reported on 05/29/2016 06/20/12   Antonietta Breach,  PA-C  pantoprazole (PROTONIX) 40 MG tablet Take 1 tablet (40 mg total) by mouth daily. Patient not taking: Reported on 05/29/2016 05/22/12   Jerene Bears, MD  predniSONE (STERAPRED UNI-PAK 21 TAB) 10 MG (21) TBPK tablet Take 6 tabs by mouth daily  for 2 days, then 5 tabs for 2 days, then 4 tabs for 2 days, then 3 tabs for 2 days, 2 tabs for 2 days, then 1 tab by mouth daily for 2 days 05/29/16   Isla Pence, MD    Family History Family History  Problem Relation Age of Onset  . Hypertension Mother   . Diabetes Mother   . Lymphoma Mother   . Thyroid disease Mother   . Lung cancer Father   . Heart murmur Brother   . Pancreatic cancer Paternal Aunt   . Cancer Paternal Aunt     unknown type  . Stomach cancer Paternal Uncle   . Colon cancer Neg Hx     Social History Social History  Substance Use Topics  . Smoking status: Never Smoker  . Smokeless tobacco: Never Used  . Alcohol use No     Allergies   Patient has no known allergies.   Review of Systems Review of Systems  Cardiovascular: Positive for chest pain.  All other  systems reviewed and are negative.    Physical Exam Updated Vital Signs BP 116/73   Pulse (!) 53   Temp 97.8 F (36.6 C) (Oral)   Resp 13   Ht 5\' 6"  (1.676 m)   Wt 245 lb 9 oz (111.4 kg)   SpO2 95%   BMI 39.63 kg/m   Physical Exam  Constitutional: She is oriented to person, place, and time. She appears well-developed and well-nourished.  HENT:  Head: Normocephalic and atraumatic.  Right Ear: External ear normal.  Left Ear: External ear normal.  Nose: Nose normal.  Mouth/Throat: Oropharynx is clear and moist.  Eyes: Conjunctivae and EOM are normal. Pupils are equal, round, and reactive to light.  Neck: Normal range of motion. Neck supple.  Cardiovascular: Normal rate, regular rhythm, normal heart sounds and intact distal pulses.   Pulmonary/Chest: Effort normal and breath sounds normal.  Abdominal: Soft. Bowel sounds are normal.    Musculoskeletal: Normal range of motion.       Arms: Neurological: She is alert and oriented to person, place, and time.  Skin: Skin is warm.  Psychiatric: She has a normal mood and affect. Her behavior is normal. Judgment and thought content normal.  Nursing note and vitals reviewed.    ED Treatments / Results  Labs (all labs ordered are listed, but only abnormal results are displayed) Labs Reviewed  BASIC METABOLIC PANEL - Abnormal; Notable for the following:       Result Value   Glucose, Bld 107 (*)    All other components within normal limits  CBC - Abnormal; Notable for the following:    RBC 5.23 (*)    Hemoglobin 15.4 (*)    HCT 46.7 (*)    All other components within normal limits  TSH  TROPONIN I  I-STAT TROPOININ, ED    EKG  EKG Interpretation  Date/Time:  Saturday May 29 2016 13:33:27 EDT Ventricular Rate:  69 PR Interval:  166 QRS Duration: 90 QT Interval:  404 QTC Calculation: 432 R Axis:   -110 Text Interpretation:  Normal sinus rhythm Right superior axis deviation Low voltage QRS Cannot rule out Anterior infarct , age undetermined Abnormal ECG Confirmed by [facility] MD, Danaly Bari ([id number]) on 05/29/2016 2:57:04 PM       Radiology Dg Chest 2 View  Result Date: 05/29/2016 CLINICAL DATA:  Chest pain EXAM: CHEST  2 VIEW COMPARISON:  04/15/2012 chest radiograph. FINDINGS: Stable cardiomediastinal silhouette with normal heart size. No pneumothorax. No pleural effusion. Lungs appear clear, with no acute consolidative airspace disease and no pulmonary edema. Stable mild eventration of the right hemidiaphragm. IMPRESSION: No active cardiopulmonary disease. Electronically Signed   By: Ilona Sorrel M.D.   On: 05/29/2016 14:33   Dg Cervical Spine Complete  Result Date: 05/29/2016 CLINICAL DATA:  Reason for exam: pain along left shoulder radiating down left arm. Pt has been having CP on/off for a year, mostly to right side (pt states that right breast is sore to touch).  Pt is especially lethargic today. Medical hx: hypoth.*comment was truncated* EXAM: CERVICAL SPINE - COMPLETE 4+ VIEW COMPARISON:  None. FINDINGS: No prevertebral soft tissue swelling. Normal alignment of the vertebral bodies. Normal spinal laminal line. Oblique projections demonstrate no traumatic narrowing of the neural foramina. Open mouth odontoid view demonstrates normal alignment of the lateral masses of C1 on C2. IMPRESSION: 1. No acute findings cervical spine. 2. No significant arthropathy identified. Electronically Signed   By: Suzy Bouchard M.D.   On: 05/29/2016 16:02  Procedures Procedures (including critical care time)  Medications Ordered in ED Medications - No data to display   Initial Impression / Assessment and Plan / ED Course  I have reviewed the triage vital signs and the nursing notes.  Pertinent labs & imaging results that were available during my care of the patient were reviewed by me and considered in my medical decision making (see chart for details).     Pt's pain is very atypical.  It has been going on for 1 year and it is reproducible.  Arm numbness is likely due to cervical radiculopathy.  Pt is stable for d/c.  She is told to establish primary care.  She knows to return if worse.  Final Clinical Impressions(s) / ED Diagnoses   Final diagnoses:  Atypical chest pain  Cervical radiculopathy    New Prescriptions New Prescriptions   PREDNISONE (STERAPRED UNI-PAK 21 TAB) 10 MG (21) TBPK TABLET    Take 6 tabs by mouth daily  for 2 days, then 5 tabs for 2 days, then 4 tabs for 2 days, then 3 tabs for 2 days, 2 tabs for 2 days, then 1 tab by mouth daily for 2 days     Isla Pence, MD 05/29/16 1635

## 2016-05-29 NOTE — ED Notes (Signed)
Patient transported to X-ray 

## 2016-05-29 NOTE — ED Triage Notes (Signed)
Py. Stated, I've been having chest pain off and on for about a year. Today its there more to right and cant get my breath all the way. I don' have any energy.

## 2017-04-14 ENCOUNTER — Emergency Department (HOSPITAL_COMMUNITY)
Admission: EM | Admit: 2017-04-14 | Discharge: 2017-04-14 | Disposition: A | Discharge status: Home / Self Care | Payer: BLUE CROSS/BLUE SHIELD | Attending: Emergency Medicine | Admitting: Emergency Medicine

## 2017-04-14 ENCOUNTER — Encounter (HOSPITAL_COMMUNITY): Payer: Self-pay

## 2017-04-14 DIAGNOSIS — Z9049 Acquired absence of other specified parts of digestive tract: Secondary | ICD-10-CM | POA: Diagnosis not present

## 2017-04-14 DIAGNOSIS — R519 Headache, unspecified: Secondary | ICD-10-CM

## 2017-04-14 DIAGNOSIS — R51 Headache: Secondary | ICD-10-CM | POA: Insufficient documentation

## 2017-04-14 DIAGNOSIS — E039 Hypothyroidism, unspecified: Secondary | ICD-10-CM | POA: Diagnosis not present

## 2017-04-14 DIAGNOSIS — G8929 Other chronic pain: Secondary | ICD-10-CM | POA: Insufficient documentation

## 2017-04-14 LAB — I-STAT CHEM 8, ED
BUN: 12 mg/dL (ref 6–20)
CHLORIDE: 101 mmol/L (ref 101–111)
Calcium, Ion: 1.12 mmol/L — ABNORMAL LOW (ref 1.15–1.40)
Creatinine, Ser: 0.7 mg/dL (ref 0.44–1.00)
GLUCOSE: 103 mg/dL — AB (ref 65–99)
HEMATOCRIT: 47 % — AB (ref 36.0–46.0)
HEMOGLOBIN: 16 g/dL — AB (ref 12.0–15.0)
POTASSIUM: 3.6 mmol/L (ref 3.5–5.1)
SODIUM: 141 mmol/L (ref 135–145)
TCO2: 29 mmol/L (ref 22–32)

## 2017-04-14 LAB — I-STAT BETA HCG BLOOD, ED (MC, WL, AP ONLY): I-stat hCG, quantitative: <5 m[IU]/mL (ref <5)

## 2017-04-14 MED ORDER — SODIUM CHLORIDE 0.9 % IV BOLUS (SEPSIS)
1000.0000 mL | Freq: Once | INTRAVENOUS | Status: AC
  Administered 2017-04-14: 1000 mL via INTRAVENOUS

## 2017-04-14 MED ORDER — KETOROLAC TROMETHAMINE 15 MG/ML IJ SOLN
15.0000 mg | Freq: Once | INTRAMUSCULAR | Status: AC
  Administered 2017-04-14: 15 mg via INTRAVENOUS
  Filled 2017-04-14: qty 1

## 2017-04-14 MED ORDER — DIPHENHYDRAMINE HCL 50 MG/ML IJ SOLN
25.0000 mg | Freq: Once | INTRAMUSCULAR | Status: AC
  Administered 2017-04-14: 25 mg via INTRAVENOUS
  Filled 2017-04-14: qty 1

## 2017-04-14 MED ORDER — PROCHLORPERAZINE EDISYLATE 5 MG/ML IJ SOLN
10.0000 mg | Freq: Once | INTRAMUSCULAR | Status: AC
  Administered 2017-04-14: 10 mg via INTRAVENOUS
  Filled 2017-04-14: qty 2

## 2017-04-14 NOTE — ED Notes (Signed)
PT states she had head CT at [facility] in December and was told the headaches are sinus related. She states she is concerned because mother died from glioblastoma

## 2017-04-14 NOTE — ED Triage Notes (Signed)
Pt reports left sided migraine since December. Pt reports she was seen in ED Tuesday and given morphine, toradol, and tramadol with relief for < 1 day.

## 2017-04-14 NOTE — Discharge Instructions (Signed)
You may continue taking prescribed medicines as needed for headache.   Please schedule an appointment to establish care with a primary doctor for evaluation of your regular headaches and further medication management.   Return to the Emergency Department if you have loss of sensation in your hands or legs, trouble with your vision, fever >100.7F.

## 2017-04-14 NOTE — ED Provider Notes (Signed)
[facility] EMERGENCY DEPARTMENT Provider Note   CSN: [id number] Arrival date & time: 04/14/17  1813     History   Chief Complaint Chief Complaint  Patient presents with  . Migraine    HPI Autumn [name] is a 52 y.o. female.  HPI   Autumn [name] is a 52 year old female with a history of diverticulitis, arthritis and obesity who presents to the emergency department for evaluation of left-sided headache.  Patient states that she has had a constant headache since December.  Has been seen at [facility] emergency department in [place] twice for her headache.  Had a CT scan which showed a sinusitis, no other acute abnormality.  States that she was last seen 2 days ago where she was given morphine and had temporary improvement.  States that the headache returned yesterday and is at 9/10 in severity and constant.  Pain is located on the top of the head on the left side.  Pain feels "throbbing" in nature.  Somewhat worsened with light and with laying on the right side.  She states that her pain seems to be improved with laying her head on the left.  Has been taking Toradol, tramadol and Fioricet which were prescribed to her from the emergency department, although states that her pain continues.  Denies worse headache of her life or sudden thunderclap.  Denies fevers, chills, night sweats, unexpected weight change, neck pain/stiffness, numbness, weakness, visual disturbance, nausea/vomiting, abdominal pain, chest pain, shortness of breath.  Reports that she does not have a primary care provider.  Past Medical History:  Diagnosis Date  . Arthritis   . Diverticulitis    last episode the sat before thanksgiving  . Diverticulosis   . Gallstones   . Hypothyroidism   . Obesity     Patient Active Problem List   Diagnosis Date Noted  . Hx of adenomatous colonic polyps 06/23/2012  . Chest pain on exertion 04/16/2012  . Morbid obesity with BMI of 40.0-44.9, adult ([place]) 04/16/2012  .  Abdominal pain, acute, epigastric 04/16/2012    Past Surgical History:  Procedure Laterality Date  . bladder tack  2007  . CHOLECYSTECTOMY    . LAPAROSCOPIC TOTAL HYSTERECTOMY      OB History    No data available       Home Medications    Prior to Admission medications   Medication Sig Start Date End Date Taking? Authorizing Provider  acetaminophen (TYLENOL) 325 MG tablet Take 650 mg by mouth every 6 (six) hours as needed for mild pain.    [provider]  levothyroxine (SYNTHROID, LEVOTHROID) 75 MCG tablet Take 1 tablet (75 mcg total) by mouth daily before breakfast. Patient not taking: Reported on 05/29/2016 04/18/12   Autumn Griffins, MD  ondansetron (ZOFRAN) 4 MG tablet Take 1 tablet (4 mg total) by mouth as needed for nausea. Patient not taking: Reported on 05/29/2016 05/11/12   Pyrtle, Autumn Lines, MD  oxyCODONE-acetaminophen (PERCOCET/ROXICET) 5-325 MG per tablet Take 1-2 tablets by mouth every 6 (six) hours as needed for pain. Patient not taking: Reported on 05/29/2016 06/20/12   Antonietta Breach, PA-C  pantoprazole (PROTONIX) 40 MG tablet Take 1 tablet (40 mg total) by mouth daily. Patient not taking: Reported on 05/29/2016 05/22/12   Pyrtle, Autumn Lines, MD  predniSONE (STERAPRED UNI-PAK 21 TAB) 10 MG (21) TBPK tablet Take 6 tabs by mouth daily  for 2 days, then 5 tabs for 2 days, then 4 tabs for 2 days, then 3 tabs  for 2 days, 2 tabs for 2 days, then 1 tab by mouth daily for 2 days 05/29/16   Autumn Pence, MD    Family History Family History  Problem Relation Age of Onset  . Hypertension Mother   . Diabetes Mother   . Lymphoma Mother   . Thyroid disease Mother   . Lung cancer Father   . Heart murmur Brother   . Pancreatic cancer Paternal Aunt   . Cancer Paternal Aunt        unknown type  . Stomach cancer Paternal Uncle   . Colon cancer Neg Hx     Social History Social History   Tobacco Use  . Smoking status: Never Smoker  . Smokeless tobacco: Never Used  Substance  Use Topics  . Alcohol use: No  . Drug use: No     Allergies   Patient has no known allergies.   Review of Systems Review of Systems  Constitutional: Negative for chills, fatigue, fever and unexpected weight change.  HENT: Negative for trouble swallowing.   Eyes: Positive for photophobia. Negative for visual disturbance.  Respiratory: Negative for shortness of breath.   Cardiovascular: Negative for chest pain.  Gastrointestinal: Negative for abdominal pain, nausea and vomiting.  Genitourinary: Negative for dysuria.  Musculoskeletal: Negative for gait problem.  Skin: Negative for rash.  Neurological: Positive for headaches. Negative for dizziness, syncope, weakness, light-headedness and numbness.  Psychiatric/Behavioral: Negative for agitation.     Physical Exam Updated Vital Signs BP (!) 146/90 (BP Location: Right Arm)   Pulse (!) 59   Temp 97.8 F (36.6 C) (Oral)   Resp 18   SpO2 96%   Physical Exam  Constitutional: She is oriented to person, place, and time. She appears well-developed and well-nourished. No distress.  Sitting at bedside in no apparent distress.   HENT:  Head: Normocephalic and atraumatic.  Mouth/Throat: Oropharynx is clear and moist. No oropharyngeal exudate.  No tenderness over maxillary or frontal sinuses.  No tenderness over the temporal arteries.  Eyes: Conjunctivae and EOM are normal. Pupils are equal, round, and reactive to light. Right eye exhibits no discharge. Left eye exhibits no discharge.  Neck: Normal range of motion. Neck supple.  Cardiovascular: Normal rate and regular rhythm. Exam reveals no friction rub.  No murmur heard. Pulmonary/Chest: Effort normal and breath sounds normal. No stridor. No respiratory distress. She has no wheezes. She has no rales.  Abdominal: Soft. Bowel sounds are normal. There is no tenderness. There is no guarding.  Musculoskeletal: Normal range of motion.  Neurological: She is alert and oriented to person,  place, and time. Coordination normal.  Mental Status:  Alert, oriented, thought content appropriate, able to give a coherent history. Speech fluent without evidence of aphasia. Able to follow 2 step commands without difficulty.  Cranial Nerves:  II:  Peripheral visual fields grossly normal, pupils equal, round, reactive to light III,IV, VI: ptosis not present, extra-ocular motions intact bilaterally  V,VII: smile symmetric, facial light touch sensation equal VIII: hearing grossly normal to voice  X: uvula elevates symmetrically  XI: bilateral shoulder shrug symmetric and strong XII: midline tongue extension without fassiculations Motor:  Normal tone. 5/5 in upper and lower extremities bilaterally including strong and equal grip strength and dorsiflexion/plantar flexion Sensory: Pinprick and light touch normal in all extremities.  Deep Tendon Reflexes: 2+ and symmetric in the biceps and patella Cerebellar: normal finger-to-nose with bilateral upper extremities Gait: normal gait and balance CV: distal pulses palpable throughout   Skin:  Skin is warm and dry. Capillary refill takes less than 2 seconds. She is not diaphoretic.  Psychiatric: She has a normal mood and affect. Her behavior is normal.  Nursing note and vitals reviewed.    ED Treatments / Results  Labs (all labs ordered are listed, but only abnormal results are displayed) Labs Reviewed  I-STAT CHEM 8, ED - Abnormal; Notable for the following components:      Result Value   Glucose, Bld 103 (*)    Calcium, Ion 1.12 (*)    Hemoglobin 16.0 (*)    HCT 47.0 (*)    All other components within normal limits  I-STAT BETA HCG BLOOD, ED ([facility], [facility], [facility] ONLY)    EKG  EKG Interpretation None       Radiology No results found.  Procedures Procedures (including critical care time)  Medications Ordered in ED Medications  sodium chloride 0.9 % bolus 1,000 mL (0 mLs Intravenous Stopped 04/14/17 2053)  prochlorperazine  (COMPAZINE) injection 10 mg (10 mg Intravenous Given 04/14/17 2018)  ketorolac (TORADOL) 15 MG/ML injection 15 mg (15 mg Intravenous Given 04/14/17 2017)  diphenhydrAMINE (BENADRYL) injection 25 mg (25 mg Intravenous Given 04/14/17 2018)     Initial Impression / Assessment and Plan / ED Course  I have reviewed the triage vital signs and the nursing notes.  Pertinent labs & imaging results that were available during my care of the patient were reviewed by me and considered in my medical decision making (see chart for details).    Pt HA treated and improved while in ED.  She recently had a CT scan at [facility] ED which was negative. No red flag symptoms. Pt is afebrile with no focal neuro deficits, nuchal rigidity, or change in vision. Exam not concerning for [facility], ICH, Meningitis, or temporal arteritis.  Pt is to follow up with PCP to discuss prophylactic medication.  Blood pressure is mildly elevated in the ER today, counseled her to have this rechecked.  Pt verbalizes understanding and is agreeable with plan to dc.   Final Clinical Impressions(s) / ED Diagnoses   Final diagnoses:  Chronic nonintractable headache, unspecified headache type    ED Discharge Orders    None       Bernarda Caffey 04/14/17 2105    Quintella Reichert, MD 04/15/17 [phone]

## 2019-10-23 ENCOUNTER — Other Ambulatory Visit: Payer: Self-pay

## 2019-10-23 ENCOUNTER — Telehealth: Payer: Self-pay

## 2019-10-23 DIAGNOSIS — Z1231 Encounter for screening mammogram for malignant neoplasm of breast: Secondary | ICD-10-CM

## 2019-10-23 NOTE — Telephone Encounter (Signed)
Left message for patient about mammo scholarship application. Left name and number for patient to call back.

## 2019-12-10 ENCOUNTER — Ambulatory Visit
Admission: RE | Admit: 2019-12-10 | Discharge: 2019-12-10 | Disposition: A | Discharge status: Home / Self Care | Payer: No Typology Code available for payment source | Source: Ambulatory Visit | Attending: Student | Admitting: Student

## 2019-12-10 ENCOUNTER — Other Ambulatory Visit: Payer: Self-pay

## 2019-12-10 DIAGNOSIS — Z1231 Encounter for screening mammogram for malignant neoplasm of breast: Secondary | ICD-10-CM

## 2020-08-25 ENCOUNTER — Telehealth: Payer: Self-pay | Admitting: *Deleted

## 2020-08-25 ENCOUNTER — Encounter: Payer: Self-pay | Admitting: *Deleted

## 2020-08-25 ENCOUNTER — Encounter: Payer: Self-pay | Admitting: Cardiology

## 2020-08-25 DIAGNOSIS — R1030 Lower abdominal pain, unspecified: Secondary | ICD-10-CM | POA: Insufficient documentation

## 2020-08-25 HISTORY — DX: Lower abdominal pain, unspecified: R10.30

## 2020-08-25 NOTE — Telephone Encounter (Signed)
Will send FYI to requesting office pt has appt 7/13. Will send clearance once the pt has been cleared.

## 2020-08-25 NOTE — Telephone Encounter (Signed)
Called Dr. Samule Dry office to request surgical clearance form for pt who is to be seen in our office 08/27/20 for clearance. Left message for them to fax form.

## 2020-08-25 NOTE — Telephone Encounter (Signed)
   [facility] Pre-operative Risk Assessment    Request for surgical clearance:  What type of surgery is being performed? Right Total Knee Arthroplasty   When is this surgery scheduled? TBD   What type of clearance is required (medical clearance vs. Pharmacy clearance to hold med vs. Both)? Both  Are there any medications that need to be held prior to surgery and how long? No   Practice name and name of physician performing surgery? [facility]   What is your office phone number [phone]    7.   What is your office fax number [phone]  8.   Anesthesia type (None, local, MAC, general) ? General   Roselie Skinner 08/25/2020, 1:46 PM  _________________________________________________________________   (provider comments below)

## 2020-08-25 NOTE — Telephone Encounter (Signed)
This patient has an appt with Dr. Harriet Masson on 7/13 for preop clearance. I will defer to her to complete clearance. I will remove from preop box.

## 2020-08-27 ENCOUNTER — Other Ambulatory Visit: Payer: Self-pay

## 2020-08-27 ENCOUNTER — Ambulatory Visit (INDEPENDENT_AMBULATORY_CARE_PROVIDER_SITE_OTHER): Payer: Self-pay | Admitting: Cardiology

## 2020-08-27 ENCOUNTER — Encounter: Payer: Self-pay | Admitting: Cardiology

## 2020-08-27 VITALS — BP 140/80 | HR 60 | Ht 67.0 in | Wt 253.0 lb

## 2020-08-27 DIAGNOSIS — R079 Chest pain, unspecified: Secondary | ICD-10-CM

## 2020-08-27 DIAGNOSIS — R7303 Prediabetes: Secondary | ICD-10-CM

## 2020-08-27 DIAGNOSIS — R0609 Other forms of dyspnea: Secondary | ICD-10-CM

## 2020-08-27 DIAGNOSIS — I10 Essential (primary) hypertension: Secondary | ICD-10-CM

## 2020-08-27 DIAGNOSIS — R400 Somnolence: Secondary | ICD-10-CM

## 2020-08-27 DIAGNOSIS — R0600 Dyspnea, unspecified: Secondary | ICD-10-CM

## 2020-08-27 DIAGNOSIS — R5383 Other fatigue: Secondary | ICD-10-CM

## 2020-08-27 NOTE — Progress Notes (Signed)
Cardiology Office Note:    Date:  08/27/2020   ID:  Autumn [name], DOB 1965/03/10, MRN [id number]  PCP:  Reita May, NP  Cardiologist:  Berniece Salines, DO  Electrophysiologist:  None   Referring MD: Reita May, NP   Chief Complaint  Patient presents with   Pre-op Exam    Right knee replacement    History of Present Illness:    Autumn [name] is a 55 y.o. female with a hx of prediabetes, hypertension, obesity is here today to be evaluated preoperatively.  The patient reports that she has been experiencing intermittent shortness of breath.  She notes that it is mostly on exertion.  In addition, she tells me that she has been experiencing significant fatigue.  She also admits that she snores and has daytime somnolence.  She also has limited by her knee she reports.  Past Medical History:  Diagnosis Date   Abdominal pain, acute, epigastric 04/16/2012   Arthritis    Chest pain on exertion 04/16/2012   Diverticulitis    last episode the sat before thanksgiving   Diverticulosis    Gallstones    Hx of adenomatous colonic polyps 06/23/2012   March 2012 - ascending colon polyp 1.2 cm = tubular adenoma (repeat March 2015)   Hypertension    Hypothyroidism    Kidney stones    Lower abdominal pain 08/25/2020   Morbid obesity with BMI of 40.0-44.9, adult ([place]) 04/16/2012   Osteoarthritis     Past Surgical History:  Procedure Laterality Date   bladder tack  02/15/2005   CHOLECYSTECTOMY     LAPAROSCOPIC TOTAL HYSTERECTOMY  2006    Current Medications: Current Meds  Medication Sig   acetaminophen (TYLENOL) 325 MG tablet Take 650 mg by mouth every 6 (six) hours as needed for mild pain.   cyclobenzaprine (FLEXERIL) 5 MG tablet Take 5 mg by mouth 3 (three) times daily as needed for muscle spasms.   diclofenac (VOLTAREN) 75 MG EC tablet Take 75 mg by mouth 2 (two) times daily as needed for moderate pain.   diclofenac Sodium (VOLTAREN) 1 % GEL Apply 2 g topically 4 (four) times  daily as needed for pain.   hydrochlorothiazide (HYDRODIURIL) 25 MG tablet Take 25 mg by mouth daily.     Allergies:   Patient has no known allergies.   Social History   Socioeconomic History   Marital status: Married    Spouse name: Not on file   Number of children: 1   Years of education: Not on file   Highest education level: Not on file  Occupational History   Occupation: ASSOCIATE    Employer: [id number]  Tobacco Use   Smoking status: Never   Smokeless tobacco: Never  Substance and Sexual Activity   Alcohol use: No   Drug use: No   Sexual activity: Not on file  Other Topics Concern   Not on file  Social History Narrative   Not on file   Social Determinants of Health   Financial Resource Strain: Not on file  Food Insecurity: Not on file  Transportation Needs: Not on file  Physical Activity: Not on file  Stress: Not on file  Social Connections: Not on file     Family History: The patient's family history includes Cancer in her paternal aunt; Diabetes in her mother; Heart murmur in her brother; Hypertension in her mother; Lung cancer in her father; Lymphoma in her mother; Pancreatic cancer in her paternal aunt; Stomach cancer in her paternal  uncle; Thyroid disease in her mother. There is no history of Colon cancer.  ROS:   Review of Systems  Constitution: Negative for decreased appetite, fever and weight gain.  HENT: Negative for congestion, ear discharge, hoarse voice and sore throat.   Eyes: Negative for discharge, redness, vision loss in right eye and visual halos.  Cardiovascular: Reports  dyspnea on exertion.negative for chest pain, leg swelling, orthopnea and palpitations.  Respiratory: Negative for cough, hemoptysis, shortness of breath and snoring.   Endocrine: Negative for heat intolerance and polyphagia.  Hematologic/Lymphatic: Negative for bleeding problem. Does not bruise/bleed easily.  Skin: Negative for flushing, nail changes, rash and suspicious  lesions.  Musculoskeletal: Negative for arthritis, joint pain, muscle cramps, myalgias, neck pain and stiffness.  Gastrointestinal: Negative for abdominal pain, bowel incontinence, diarrhea and excessive appetite.  Genitourinary: Negative for decreased libido, genital sores and incomplete emptying.  Neurological: Negative for brief paralysis, focal weakness, headaches and loss of balance.  Psychiatric/Behavioral: Negative for altered mental status, depression and suicidal ideas.  Allergic/Immunologic: Negative for HIV exposure and persistent infections.    EKGs/Labs/Other Studies Reviewed:    The following studies were reviewed today:   EKG:  The ekg ordered today demonstrates sinus rhythm, heart rate 60 bpm  Transthoracic echocardiogram done in 2014 Left ventricle:  The cavity size was normal. Wall thickness  was normal. Systolic function was normal. The estimated  ejection fraction was in the range of 55% to 60%. The  transmitral flow pattern was normal. The deceleration time  of the early transmitral flow velocity was normal. The  pulmonary vein flow pattern was normal. The tissue Doppler  parameters were normal. Left ventricular diastolic function  parameters were normal.   ------------------------------------------------------------  Aortic valve:   Structurally normal valve.   Cusp separation  was normal.  Doppler:  Transvalvular velocity was within the  normal range. There was no stenosis.  No regurgitation.     ------------------------------------------------------------  Mitral valve:   Structurally normal valve.   Leaflet  separation was normal.  Doppler:  Transvalvular velocity was  within the normal range. There was no evidence for stenosis.   No regurgitation.   ------------------------------------------------------------  Left atrium:  The atrium was mildly dilated.   ------------------------------------------------------------  Atrial septum:  Poorly  visualized.   ------------------------------------------------------------  Right ventricle:  The cavity size was normal. Wall thickness  was normal. Systolic function was normal.   ------------------------------------------------------------  Pulmonic valve:    Structurally normal valve.   Cusp  separation was normal.  Doppler:  Transvalvular velocity was  within the normal range.  No regurgitation.   ------------------------------------------------------------  Tricuspid valve:   Structurally normal valve.   Leaflet  separation was normal.  Doppler:  Transvalvular velocity was  within the normal range.  No regurgitation.   ------------------------------------------------------------  Pulmonary artery:   Poorly visualized.   ------------------------------------------------------------  Right atrium:  The atrium was normal in size.   ------------------------------------------------------------  Pericardium:  There was no pericardial effusion.   ------------------------------------------------------------  Systemic veins:  Inferior vena cava: The vessel was normal in size; the  respirophasic diameter changes were in the normal range (=  50%); findings are consistent with normal central venous  pressure.     Recent Labs: No results found for requested labs within last 8760 hours.  Recent Lipid Panel    Component Value Date/Time   CHOL 183 04/16/2012 1050   TRIG 117 04/16/2012 1050   HDL 48 04/16/2012 1050   CHOLHDL 3.8 04/16/2012 1050  VLDL 23 04/16/2012 1050   LDLCALC 112 (H) 04/16/2012 1050    Physical Exam:    VS:  BP 140/80 (BP Location: Right Arm, Patient Position: Sitting, Cuff Size: Normal)   Pulse 60   Ht 5\' 7"  (1.702 m)   Wt 253 lb (114.8 kg)   SpO2 95%   BMI 39.63 kg/m     Wt Readings from Last 3 Encounters:  08/27/20 253 lb (114.8 kg)  08/19/20 252 lb (114.3 kg)  05/29/16 245 lb 9 oz (111.4 kg)     GEN: Well nourished, well developed in no acute  distress HEENT: Normal NECK: No JVD; No carotid bruits LYMPHATICS: No lymphadenopathy CARDIAC: S1S2 noted,RRR, no murmurs, rubs, gallops RESPIRATORY:  Clear to auscultation without rales, wheezing or rhonchi  ABDOMEN: Soft, non-tender, non-distended, +bowel sounds, no guarding. EXTREMITIES: No edema, No cyanosis, no clubbing MUSCULOSKELETAL:  No deformity  SKIN: Warm and dry NEUROLOGIC:  Alert and oriented x 3, non-focal PSYCHIATRIC:  Normal affect, good insight  ASSESSMENT:    1. Fatigue, unspecified type   2. Chest pain, unspecified type   3. Hypertension, unspecified type   4. DOE (dyspnea on exertion)   5. Prediabetes   6. Daytime somnolence    PLAN:    She is got risk factors given her dyspnea and exertional like to proceed with an ischemic evaluation in this patient prior to her surgery.  Pharmacological nuclear stress test will be ordered today.  I have talked to the patient about this test and she is agreeable to proceed with this.  For her fatigue, snoring and daytime somnolence we will order a sleep study to rule out sleep apnea in this patient which also could be causing her nocturnal dyspnea.  The patient understands the need to lose weight with diet and exercise. We have discussed specific strategies for this.  The patient is in agreement with the above plan. The patient left the office in stable condition.  The patient will follow up in 3 months in [place] line.   Medication Adjustments/Labs and Tests Ordered: Current medicines are reviewed at length with the patient today.  Concerns regarding medicines are outlined above.  Orders Placed This Encounter  Procedures   VITAMIN D 25 Hydroxy (Vit-D Deficiency, Fractures)   MYOCARDIAL PERFUSION IMAGING   EKG 12-Lead   ECHOCARDIOGRAM COMPLETE   Split night study   No orders of the defined types were placed in this encounter.   Patient Instructions  Medication Instructions:  Your physician recommends that you  continue on your current medications as directed. Please refer to the Current Medication list given to you today.  *If you need a refill on your cardiac medications before your next appointment, please call your pharmacy*   Lab Work: Your physician recommends that you return for lab work in:  TODAY: Vitamin D  If you have labs (blood work) drawn today and your tests are completely normal, you will receive your results only by: [place] (if you have MyChart) OR A paper copy in the mail If you have any lab test that is abnormal or we need to change your treatment, we will call you to review the results.   Testing/Procedures: Your physician has requested that you have an echocardiogram. Echocardiography is a painless test that uses sound waves to create images of your heart. It provides your doctor with information about the size and shape of your heart and how well your heart's chambers and valves are working. This procedure takes approximately  one hour. There are no restrictions for this procedure.  Your physician has requested that you have a lexiscan myoview. For further information please visit [email or web address]. Please follow instruction sheet, as given.  Your physician has recommended that you have a sleep study. This test records several body functions during sleep, including: brain activity, eye movement, oxygen and carbon dioxide blood levels, heart rate and rhythm, breathing rate and rhythm, the flow of air through your mouth and nose, snoring, body muscle movements, and chest and belly movement.    Follow-Up: At [facility], you and your health needs are our priority.  As part of our continuing mission to provide you with exceptional heart care, we have created designated Provider Care Teams.  These Care Teams include your primary Cardiologist (physician) and Advanced Practice Providers (APPs -  Physician Assistants and Nurse Practitioners) who all work together to  provide you with the care you need, when you need it.  We recommend signing up for the patient portal called "MyChart".  Sign up information is provided on this After Visit Summary.  MyChart is used to connect with patients for Virtual Visits (Telemedicine).  Patients are able to view lab/test results, encounter notes, upcoming appointments, etc.  Non-urgent messages can be sent to your provider as well.   To learn more about what you can do with MyChart, go to [email or web address].    Your next appointment:   3 month(s)  The format for your next appointment:   In Person  Provider:   [address] - Berniece Salines, DO    Other Instructions Sleep Study, Adult A sleep study (polysomnogram) is a series of tests done while you are sleeping. A sleep study records your brain waves, heart rate, breathing rate, oxygen level, and eye and legmovements. A sleep study helps your health care provider: See how well you sleep. Diagnose a sleep disorder. Determine how severe your sleep disorder is. Create a plan to treat your sleep disorder. Your health care provider may recommend a sleep study if you: Feel sleepy on most days. Snore loudly while sleeping. Have unusual behaviors while you sleep, such as walking. Have brief periods in which you stop breathing during sleep (sleepapnea). Fall asleep suddenly during the day (narcolepsy). Have trouble falling asleep or staying asleep (insomnia). Feel like you need to move your legs when trying to fall asleep (restless legs syndrome). Move your legs by flexing and extending them regularly while asleep (periodic limb movement disorder). Act out your dreams while you sleep (sleep behavior disorder). Feel like you cannot move when you first wake up (sleep paralysis). What tests are part of a sleep study? Most sleep studies record the following during sleep: Brain activity. Eye movements. Heart rate and rhythm. Breathing rate and rhythm. Blood-oxygen  level. Blood pressure. Chest and belly movement as you breathe. Arm and leg movements. Snoring or other noises. Body position. Where are sleep studies done? Sleep studies are done at sleep centers. A sleep center may be inside [place], office, or clinic. The room where you have the study may look like a hospital room or a hotel room. The health care providers doing the study may come in and out of the room during the study. Most of the time, they will be in another room monitoringyour test as you sleep. How are sleep studies done? Most sleep studies are done during a normal period of time for a full night of sleep. You will arrive at the study center in the evening and  go home in themorning. Before the test Bring your pajamas and toothbrush with you to the sleep study. Do not have caffeine on the day of your sleep study. Do not drink alcohol on the day of your sleep study. Your health care provider will let you know if you should stop taking any of your regular medicines before the test. During the test     Round, sticky patches with sensors attached to recording wires (electrodes) are placed on your scalp, face, chest, and limbs. Wires from all the electrodes and sensors run from your bed to a computer. The wires can be taken off and put back on if you need to get out of bed to go to the bathroom. A sensor is placed over your nose to measure airflow. A finger clip is put on your finger or ear to measure your blood oxygen level (pulse oximetry). A belt is placed around your belly and a belt is placed around your chest to measure breathing movements. If you have signs of the sleep disorder called sleep apnea during your test, you may get a treatment mask to wear for the second half of the night. The mask provides positive airway pressure (PAP) to help you breathe better during sleep. This may greatly improve your sleep apnea. You will then have all tests done again with the mask in place  to see if your measurements and recordings change. After the test A medical doctor who specializes in sleep will evaluate the results of your sleep study and share them with you and your primary health care provider. Based on your results, your medical history, and a physical exam, you may be diagnosed with a sleep disorder, such as: Sleep apnea. Restless legs syndrome. Sleep-related behavior disorder. Sleep-related movement disorders. Sleep-related seizure disorders. Your health care team will help determine your treatment options based on your diagnosis. This may include: Improving your sleep habits (sleep hygiene). Wearing a continuous positive airway pressure (CPAP) or bi-level positive airway pressure (BPAP) mask. Wearing an oral device at night to improve breathing and reduce snoring. Taking medicines. Follow these instructions at home: Take over-the-counter and prescription medicines only as told by your health care provider. If you are instructed to use a CPAP or BPAP mask, make sure you use it nightly as directed. Make any lifestyle changes that your health care provider recommends. If you were given a device to open your airway while you sleep, use it only as told by your health care provider. Do not use any tobacco products, such as cigarettes, chewing tobacco, and e-cigarettes. If you need help quitting, ask your health care provider. Keep all follow-up visits as told by your health care provider. This is important. Summary A sleep study (polysomnogram) is a series of tests done while you are sleeping. It shows how well you sleep. Most sleep studies are done over one full night of sleep. You will arrive at the study center in the evening and go home in the morning. If you have signs of the sleep disorder called sleep apnea during your test, you may get a treatment mask to wear for the second half of the night. A medical doctor who specializes in sleep will evaluate the results of  your sleep study and share them with your primary health care provider. This information is not intended to replace advice given to you by your health care provider. Make sure you discuss any questions you have with your healthcare provider. Document Revised: 03/09/2019 Document Reviewed:  03/01/2017 Elsevier Patient Education  2022 [place].  Cardiac Nuclear Scan A cardiac nuclear scan is a test that is done to check the flow of blood to your heart. It is done when you are resting and when you are exercising. The test looks for problems such as: Not enough blood reaching a portion of the heart. The heart muscle not working as it should. You may need this test if: You have heart disease. You have had lab results that are not normal. You have had heart surgery or a balloon procedure to open up blocked arteries (angioplasty). You have chest pain. You have shortness of breath. In this test, a special dye (tracer) is put into your bloodstream. The tracer will travel to your heart. A camera will then take pictures of your heart to see how the tracer moves through yourheart. This test is usually done at a hospital and takes 2-4 hours. Tell a doctor about: Any allergies you have. All medicines you are taking, including vitamins, herbs, eye drops, creams, and over-the-counter medicines. Any problems you or family members have had with anesthetic medicines. Any blood disorders you have. Any surgeries you have had. Any medical conditions you have. Whether you are pregnant or may be pregnant. What are the risks? Generally, this is a safe test. However, problems may occur, such as: Serious chest pain and heart attack. This is only a risk if the stress portion of the test is done. Rapid heartbeat. A feeling of warmth in your chest. This feeling usually does not last long. Allergic reaction to the tracer. What happens before the test? Ask your doctor about changing or stopping your normal  medicines. This is important. Follow instructions from your doctor about what you cannot eat or drink. Remove your jewelry on the day of the test. What happens during the test? An IV tube will be inserted into one of your veins. Your doctor will give you a small amount of tracer through the IV tube. You will wait for 20-40 minutes while the tracer moves through your bloodstream. Your heart will be monitored with an electrocardiogram (ECG). You will lie down on an exam table. Pictures of your heart will be taken for about 15-20 minutes. You may also have a stress test. For this test, one of these things may be done: You will be asked to exercise on a treadmill or a stationary bike. You will be given medicines that will make your heart work harder. This is done if you are unable to exercise. When blood flow to your heart has peaked, a tracer will again be given through the IV tube. After 20-40 minutes, you will get back on the exam table. More pictures will be taken of your heart. Depending on the tracer that is used, more pictures may need to be taken 3-4 hours later. Your IV tube will be removed when the test is over. The test may vary among doctors and hospitals. What happens after the test? Ask your doctor: Whether you can return to your normal schedule, including diet, activities, and medicines. Whether you should drink more fluids. This will help to remove the tracer from your body. Drink enough fluid to keep your pee (urine) pale yellow. Ask your doctor, or the department that is doing the test: When will my results be ready? How will I get my results? Summary A cardiac nuclear scan is a test that is done to check the flow of blood to your heart. Tell your doctor whether  you are pregnant or may be pregnant. Before the test, ask your doctor about changing or stopping your normal medicines. This is important. Ask your doctor whether you can return to your normal activities. You may be  asked to drink more fluids. This information is not intended to replace advice given to you by your health care provider. Make sure you discuss any questions you have with your healthcare provider. Document Revised: 05/24/2018 Document Reviewed: 07/18/2017 Elsevier Patient Education  2022 [place].  Echocardiogram An echocardiogram is a test that uses sound waves (ultrasound) to produce images of the heart. Images from an echocardiogram can provide important information about: Heart size and shape. The size and thickness and movement of your heart's walls. Heart muscle function and strength. Heart valve function or if you have stenosis. Stenosis is when the heart valves are too narrow. If blood is flowing backward through the heart valves (regurgitation). A tumor or infectious growth around the heart valves. Areas of heart muscle that are not working well because of poor blood flow or injury from a heart attack. Aneurysm detection. An aneurysm is a weak or damaged part of an artery wall. The wall bulges out from the normal force of blood pumping through the body. Tell a health care provider about: Any allergies you have. All medicines you are taking, including vitamins, herbs, eye drops, creams, and over-the-counter medicines. Any blood disorders you have. Any surgeries you have had. Any medical conditions you have. Whether you are pregnant or may be pregnant. What are the risks? Generally, this is a safe test. However, problems may occur, including an allergic reaction to dye (contrast) that may be used during the test. What happens before the test? No specific preparation is needed. You may eat and drink normally. What happens during the test?  You will take off your clothes from the waist up and put on a hospital gown. Electrodes or electrocardiogram (ECG)patches may be placed on your chest. The electrodes or patches are then connected to a device that monitors your heart rate  and rhythm. You will lie down on a table for an ultrasound exam. A gel will be applied to your chest to help sound waves pass through your skin. A handheld device, called a transducer, will be pressed against your chest and moved over your heart. The transducer produces sound waves that travel to your heart and bounce back (or "echo" back) to the transducer. These sound waves will be captured in real-time and changed into images of your heart that can be viewed on a video monitor. The images will be recorded on a computer and reviewed by your health care provider. You may be asked to change positions or hold your breath for a short time. This makes it easier to get different views or better views of your heart. In some cases, you may receive contrast through an IV in one of your veins. This can improve the quality of the pictures from your heart. The procedure may vary among health care providers and hospitals. What can I expect after the test? You may return to your normal, everyday life, including diet, activities, andmedicines, unless your health care provider tells you not to do that. Follow these instructions at home: It is up to you to get the results of your test. Ask your health care provider, or the department that is doing the test, when your results will be ready. Keep all follow-up visits. This is important. Summary An echocardiogram is a test  that uses sound waves (ultrasound) to produce images of the heart. Images from an echocardiogram can provide important information about the size and shape of your heart, heart muscle function, heart valve function, and other possible heart problems. You do not need to do anything to prepare before this test. You may eat and drink normally. After the echocardiogram is completed, you may return to your normal, everyday life, unless your health care provider tells you not to do that. This information is not intended to replace advice given to you by  your health care provider. Make sure you discuss any questions you have with your healthcare provider. Document Revised: 09/25/2019 Document Reviewed: 09/25/2019 Elsevier Patient Education  2022 [employer].    Adopting a Healthy Lifestyle.  Know what a healthy weight is for you (roughly BMI <25) and aim to maintain this   Aim for 7+ servings of fruits and vegetables daily   65-80+ fluid ounces of water or unsweet tea for healthy kidneys   Limit to max 1 drink of alcohol per day; avoid smoking/tobacco   Limit animal fats in diet for cholesterol and heart health - choose grass fed whenever available   Avoid highly processed foods, and foods high in saturated/trans fats   Aim for low stress - take time to unwind and care for your mental health   Aim for 150 min of moderate intensity exercise weekly for heart health, and weights twice weekly for bone health   Aim for 7-9 hours of sleep daily   When it comes to diets, agreement about the perfect plan isnt easy to find, even among the experts. Experts at the [employer] developed an idea known as the Healthy Eating Plate. Just imagine a plate divided into logical, healthy portions.   The emphasis is on diet quality:   Load up on vegetables and fruits - one-half of your plate: Aim for color and variety, and remember that potatoes dont count.   Go for whole grains - one-quarter of your plate: Whole wheat, barley, wheat berries, quinoa, oats, brown rice, and foods made with them. If you want pasta, go with whole wheat pasta.   Protein power - one-quarter of your plate: Fish, chicken, beans, and nuts are all healthy, versatile protein sources. Limit red meat.   The diet, however, does go beyond the plate, offering a few other suggestions.   Use healthy plant oils, such as olive, canola, soy, corn, sunflower and peanut. Check the labels, and avoid partially hydrogenated oil, which have unhealthy trans fats.   If  youre thirsty, drink water. Coffee and tea are good in moderation, but skip sugary drinks and limit milk and dairy products to one or two daily servings.   The type of carbohydrate in the diet is more important than the amount. Some sources of carbohydrates, such as vegetables, fruits, whole grains, and beans-are healthier than others.   Finally, stay active  Signed, Berniece Salines, DO  08/27/2020 2:30 PM    [place]

## 2020-08-27 NOTE — Patient Instructions (Signed)
Medication Instructions:  Your physician recommends that you continue on your current medications as directed. Please refer to the Current Medication list given to you today.  *If you need a refill on your cardiac medications before your next appointment, please call your pharmacy*   Lab Work: Your physician recommends that you return for lab work in:  TODAY: Vitamin D  If you have labs (blood work) drawn today and your tests are completely normal, you will receive your results only by: [place] (if you have MyChart) OR A paper copy in the mail If you have any lab test that is abnormal or we need to change your treatment, we will call you to review the results.   Testing/Procedures: Your physician has requested that you have an echocardiogram. Echocardiography is a painless test that uses sound waves to create images of your heart. It provides your doctor with information about the size and shape of your heart and how well your heart's chambers and valves are working. This procedure takes approximately one hour. There are no restrictions for this procedure.  Your physician has requested that you have a lexiscan myoview. For further information please visit [email or web address]. Please follow instruction sheet, as given.  Your physician has recommended that you have a sleep study. This test records several body functions during sleep, including: brain activity, eye movement, oxygen and carbon dioxide blood levels, heart rate and rhythm, breathing rate and rhythm, the flow of air through your mouth and nose, snoring, body muscle movements, and chest and belly movement.    Follow-Up: At [facility], you and your health needs are our priority.  As part of our continuing mission to provide you with exceptional heart care, we have created designated Provider Care Teams.  These Care Teams include your primary Cardiologist (physician) and Advanced Practice Providers (APPs -  Physician  Assistants and Nurse Practitioners) who all work together to provide you with the care you need, when you need it.  We recommend signing up for the patient portal called "MyChart".  Sign up information is provided on this After Visit Summary.  MyChart is used to connect with patients for Virtual Visits (Telemedicine).  Patients are able to view lab/test results, encounter notes, upcoming appointments, etc.  Non-urgent messages can be sent to your provider as well.   To learn more about what you can do with MyChart, go to [email or web address].    Your next appointment:   3 month(s)  The format for your next appointment:   In Person  Provider:   [address] - Berniece Salines, DO    Other Instructions Sleep Study, Adult A sleep study (polysomnogram) is a series of tests done while you are sleeping. A sleep study records your brain waves, heart rate, breathing rate, oxygen level, and eye and legmovements. A sleep study helps your health care provider: See how well you sleep. Diagnose a sleep disorder. Determine how severe your sleep disorder is. Create a plan to treat your sleep disorder. Your health care provider may recommend a sleep study if you: Feel sleepy on most days. Snore loudly while sleeping. Have unusual behaviors while you sleep, such as walking. Have brief periods in which you stop breathing during sleep (sleepapnea). Fall asleep suddenly during the day (narcolepsy). Have trouble falling asleep or staying asleep (insomnia). Feel like you need to move your legs when trying to fall asleep (restless legs syndrome). Move your legs by flexing and extending them regularly while asleep (periodic limb movement disorder). Act  out your dreams while you sleep (sleep behavior disorder). Feel like you cannot move when you first wake up (sleep paralysis). What tests are part of a sleep study? Most sleep studies record the following during sleep: Brain activity. Eye  movements. Heart rate and rhythm. Breathing rate and rhythm. Blood-oxygen level. Blood pressure. Chest and belly movement as you breathe. Arm and leg movements. Snoring or other noises. Body position. Where are sleep studies done? Sleep studies are done at sleep centers. A sleep center may be inside [place], office, or clinic. The room where you have the study may look like a hospital room or a hotel room. The health care providers doing the study may come in and out of the room during the study. Most of the time, they will be in another room monitoringyour test as you sleep. How are sleep studies done? Most sleep studies are done during a normal period of time for a full night of sleep. You will arrive at the study center in the evening and go home in themorning. Before the test Bring your pajamas and toothbrush with you to the sleep study. Do not have caffeine on the day of your sleep study. Do not drink alcohol on the day of your sleep study. Your health care provider will let you know if you should stop taking any of your regular medicines before the test. During the test     Round, sticky patches with sensors attached to recording wires (electrodes) are placed on your scalp, face, chest, and limbs. Wires from all the electrodes and sensors run from your bed to a computer. The wires can be taken off and put back on if you need to get out of bed to go to the bathroom. A sensor is placed over your nose to measure airflow. A finger clip is put on your finger or ear to measure your blood oxygen level (pulse oximetry). A belt is placed around your belly and a belt is placed around your chest to measure breathing movements. If you have signs of the sleep disorder called sleep apnea during your test, you may get a treatment mask to wear for the second half of the night. The mask provides positive airway pressure (PAP) to help you breathe better during sleep. This may greatly improve your  sleep apnea. You will then have all tests done again with the mask in place to see if your measurements and recordings change. After the test A medical doctor who specializes in sleep will evaluate the results of your sleep study and share them with you and your primary health care provider. Based on your results, your medical history, and a physical exam, you may be diagnosed with a sleep disorder, such as: Sleep apnea. Restless legs syndrome. Sleep-related behavior disorder. Sleep-related movement disorders. Sleep-related seizure disorders. Your health care team will help determine your treatment options based on your diagnosis. This may include: Improving your sleep habits (sleep hygiene). Wearing a continuous positive airway pressure (CPAP) or bi-level positive airway pressure (BPAP) mask. Wearing an oral device at night to improve breathing and reduce snoring. Taking medicines. Follow these instructions at home: Take over-the-counter and prescription medicines only as told by your health care provider. If you are instructed to use a CPAP or BPAP mask, make sure you use it nightly as directed. Make any lifestyle changes that your health care provider recommends. If you were given a device to open your airway while you sleep, use it only as told by  your health care provider. Do not use any tobacco products, such as cigarettes, chewing tobacco, and e-cigarettes. If you need help quitting, ask your health care provider. Keep all follow-up visits as told by your health care provider. This is important. Summary A sleep study (polysomnogram) is a series of tests done while you are sleeping. It shows how well you sleep. Most sleep studies are done over one full night of sleep. You will arrive at the study center in the evening and go home in the morning. If you have signs of the sleep disorder called sleep apnea during your test, you may get a treatment mask to wear for the second half of the  night. A medical doctor who specializes in sleep will evaluate the results of your sleep study and share them with your primary health care provider. This information is not intended to replace advice given to you by your health care provider. Make sure you discuss any questions you have with your healthcare provider. Document Revised: 03/09/2019 Document Reviewed: 03/01/2017 Elsevier Patient Education  2022 [employer].  Cardiac Nuclear Scan A cardiac nuclear scan is a test that is done to check the flow of blood to your heart. It is done when you are resting and when you are exercising. The test looks for problems such as: Not enough blood reaching a portion of the heart. The heart muscle not working as it should. You may need this test if: You have heart disease. You have had lab results that are not normal. You have had heart surgery or a balloon procedure to open up blocked arteries (angioplasty). You have chest pain. You have shortness of breath. In this test, a special dye (tracer) is put into your bloodstream. The tracer will travel to your heart. A camera will then take pictures of your heart to see how the tracer moves through yourheart. This test is usually done at a hospital and takes 2-4 hours. Tell a doctor about: Any allergies you have. All medicines you are taking, including vitamins, herbs, eye drops, creams, and over-the-counter medicines. Any problems you or family members have had with anesthetic medicines. Any blood disorders you have. Any surgeries you have had. Any medical conditions you have. Whether you are pregnant or may be pregnant. What are the risks? Generally, this is a safe test. However, problems may occur, such as: Serious chest pain and heart attack. This is only a risk if the stress portion of the test is done. Rapid heartbeat. A feeling of warmth in your chest. This feeling usually does not last long. Allergic reaction to the tracer. What happens  before the test? Ask your doctor about changing or stopping your normal medicines. This is important. Follow instructions from your doctor about what you cannot eat or drink. Remove your jewelry on the day of the test. What happens during the test? An IV tube will be inserted into one of your veins. Your doctor will give you a small amount of tracer through the IV tube. You will wait for 20-40 minutes while the tracer moves through your bloodstream. Your heart will be monitored with an electrocardiogram (ECG). You will lie down on an exam table. Pictures of your heart will be taken for about 15-20 minutes. You may also have a stress test. For this test, one of these things may be done: You will be asked to exercise on a treadmill or a stationary bike. You will be given medicines that will make your heart work harder.  This is done if you are unable to exercise. When blood flow to your heart has peaked, a tracer will again be given through the IV tube. After 20-40 minutes, you will get back on the exam table. More pictures will be taken of your heart. Depending on the tracer that is used, more pictures may need to be taken 3-4 hours later. Your IV tube will be removed when the test is over. The test may vary among doctors and hospitals. What happens after the test? Ask your doctor: Whether you can return to your normal schedule, including diet, activities, and medicines. Whether you should drink more fluids. This will help to remove the tracer from your body. Drink enough fluid to keep your pee (urine) pale yellow. Ask your doctor, or the department that is doing the test: When will my results be ready? How will I get my results? Summary A cardiac nuclear scan is a test that is done to check the flow of blood to your heart. Tell your doctor whether you are pregnant or may be pregnant. Before the test, ask your doctor about changing or stopping your normal medicines. This is important. Ask  your doctor whether you can return to your normal activities. You may be asked to drink more fluids. This information is not intended to replace advice given to you by your health care provider. Make sure you discuss any questions you have with your healthcare provider. Document Revised: 05/24/2018 Document Reviewed: 07/18/2017 Elsevier Patient Education  2022 [employer].  Echocardiogram An echocardiogram is a test that uses sound waves (ultrasound) to produce images of the heart. Images from an echocardiogram can provide important information about: Heart size and shape. The size and thickness and movement of your heart's walls. Heart muscle function and strength. Heart valve function or if you have stenosis. Stenosis is when the heart valves are too narrow. If blood is flowing backward through the heart valves (regurgitation). A tumor or infectious growth around the heart valves. Areas of heart muscle that are not working well because of poor blood flow or injury from a heart attack. Aneurysm detection. An aneurysm is a weak or damaged part of an artery wall. The wall bulges out from the normal force of blood pumping through the body. Tell a health care provider about: Any allergies you have. All medicines you are taking, including vitamins, herbs, eye drops, creams, and over-the-counter medicines. Any blood disorders you have. Any surgeries you have had. Any medical conditions you have. Whether you are pregnant or may be pregnant. What are the risks? Generally, this is a safe test. However, problems may occur, including an allergic reaction to dye (contrast) that may be used during the test. What happens before the test? No specific preparation is needed. You may eat and drink normally. What happens during the test?  You will take off your clothes from the waist up and put on a hospital gown. Electrodes or electrocardiogram (ECG)patches may be placed on your chest. The electrodes  or patches are then connected to a device that monitors your heart rate and rhythm. You will lie down on a table for an ultrasound exam. A gel will be applied to your chest to help sound waves pass through your skin. A handheld device, called a transducer, will be pressed against your chest and moved over your heart. The transducer produces sound waves that travel to your heart and bounce back (or "echo" back) to the transducer. These sound waves will be captured  in real-time and changed into images of your heart that can be viewed on a video monitor. The images will be recorded on a computer and reviewed by your health care provider. You may be asked to change positions or hold your breath for a short time. This makes it easier to get different views or better views of your heart. In some cases, you may receive contrast through an IV in one of your veins. This can improve the quality of the pictures from your heart. The procedure may vary among health care providers and hospitals. What can I expect after the test? You may return to your normal, everyday life, including diet, activities, andmedicines, unless your health care provider tells you not to do that. Follow these instructions at home: It is up to you to get the results of your test. Ask your health care provider, or the department that is doing the test, when your results will be ready. Keep all follow-up visits. This is important. Summary An echocardiogram is a test that uses sound waves (ultrasound) to produce images of the heart. Images from an echocardiogram can provide important information about the size and shape of your heart, heart muscle function, heart valve function, and other possible heart problems. You do not need to do anything to prepare before this test. You may eat and drink normally. After the echocardiogram is completed, you may return to your normal, everyday life, unless your health care provider tells you not to do  that. This information is not intended to replace advice given to you by your health care provider. Make sure you discuss any questions you have with your healthcare provider. Document Revised: 09/25/2019 Document Reviewed: 09/25/2019 Elsevier Patient Education  2022 [employer].

## 2020-08-28 ENCOUNTER — Telehealth: Payer: Self-pay

## 2020-08-28 LAB — VITAMIN D 25 HYDROXY (VIT D DEFICIENCY, FRACTURES): Vit D, 25-Hydroxy: 18.8 ng/mL — ABNORMAL LOW (ref 30.0–100.0)

## 2020-08-28 NOTE — Telephone Encounter (Signed)
Spoke with the patient, detailed instructions given. She stated that she would be here for her test. Asked to call back with any questions. AutumnBeda [name] EMTP 

## 2020-08-29 ENCOUNTER — Telehealth: Payer: Self-pay | Admitting: Cardiology

## 2020-08-29 MED ORDER — VITAMIN D (ERGOCALCIFEROL) 1.25 MG (50000 UNIT) PO CAPS: 50000.0000 [IU] | ORAL_CAPSULE | ORAL | 0 refills | Status: DC

## 2020-08-29 NOTE — Telephone Encounter (Signed)
Thank you :)

## 2020-08-29 NOTE — Addendum Note (Signed)
Addended by: Wynema Birch on: 08/29/2020 01:26 PM   Modules accepted: Orders

## 2020-08-29 NOTE — Telephone Encounter (Signed)
Patient is returning call to discuss lab results. 

## 2020-08-29 NOTE — Telephone Encounter (Addendum)
Able to reach pt regarding her recent lab work, Dr. Harriet Masson had a chance to review her results and advised   "Vitamin D deficiency - please replace vit D 50,000 IU once weekly for 12 weeks"  Autumn [name] verbalized understanding, asked script to be sent to [facility], if too expensive, she will have [facility] try to fill medication for her as she has no insurance.    GoodRx shows list price as $5.92 for 12 capsules BIN [id number] PCN [id number] [facility] [id number] Member ID [id number]  This was advised to pharmacy with pt's script.   Otherwise. all questions were address and no additional concerns at this time. Agreeable to plan, will call back for anything further.

## 2020-09-02 ENCOUNTER — Other Ambulatory Visit: Payer: Self-pay

## 2020-09-02 ENCOUNTER — Ambulatory Visit (INDEPENDENT_AMBULATORY_CARE_PROVIDER_SITE_OTHER): Payer: Self-pay

## 2020-09-02 ENCOUNTER — Telehealth: Payer: Self-pay

## 2020-09-02 DIAGNOSIS — R0609 Other forms of dyspnea: Secondary | ICD-10-CM

## 2020-09-02 DIAGNOSIS — R079 Chest pain, unspecified: Secondary | ICD-10-CM

## 2020-09-02 DIAGNOSIS — R0600 Dyspnea, unspecified: Secondary | ICD-10-CM

## 2020-09-02 MED ORDER — REGADENOSON 0.4 MG/5ML IV SOLN
0.4000 mg | Freq: Once | INTRAVENOUS | Status: AC
  Administered 2020-09-02: 0.4 mg via INTRAVENOUS

## 2020-09-02 MED ORDER — TECHNETIUM TC 99M TETROFOSMIN IV KIT
32.4000 | PACK | Freq: Once | INTRAVENOUS | Status: AC | PRN
  Administered 2020-09-02: 32.4 via INTRAVENOUS

## 2020-09-02 NOTE — Telephone Encounter (Signed)
-----   Message from Lauralee Evener, [place] sent at 08/29/2020  4:08 PM EDT ----- There is no insurance to precert per chart. Double check with the patient. If no, then ok to schedule. ----- Message ----- From: Orvan July, RN Sent: 08/28/2020   6:22 PM EDT To: [facility] Div Sleep Studies  Needs precert for sleep study.  Thank you,    [facility]

## 2020-09-02 NOTE — Telephone Encounter (Addendum)
Called patient to inquire if she has insurance or not in regards to the sleep study and out of pocket expense. No answer, left message for her to return the call.

## 2020-09-03 ENCOUNTER — Ambulatory Visit: Payer: Self-pay

## 2020-09-03 LAB — MYOCARDIAL PERFUSION IMAGING
LV dias vol: 103 mL (ref 46–106)
LV sys vol: 40 mL
Peak HR: 82 {beats}/min
Rest HR: 65 {beats}/min
SDS: 1
SRS: 1
SSS: 2
TID: 0.96

## 2020-09-03 MED ORDER — TECHNETIUM TC 99M TETROFOSMIN IV KIT
32.8000 | PACK | Freq: Once | INTRAVENOUS | Status: AC | PRN
  Administered 2020-09-03: 32.8 via INTRAVENOUS

## 2020-09-17 ENCOUNTER — Other Ambulatory Visit: Payer: Self-pay

## 2020-09-17 ENCOUNTER — Ambulatory Visit (INDEPENDENT_AMBULATORY_CARE_PROVIDER_SITE_OTHER): Payer: Self-pay

## 2020-09-17 DIAGNOSIS — R5383 Other fatigue: Secondary | ICD-10-CM

## 2020-09-17 LAB — ECHOCARDIOGRAM COMPLETE
Area-P 1/2: 2.67 cm2
S' Lateral: 3.7 cm

## 2020-09-17 NOTE — Progress Notes (Signed)
Complete echocardiogram performed.  Jimmy Keymon Mcelroy RDCS, RVT  

## 2020-09-26 ENCOUNTER — Telehealth: Payer: Self-pay

## 2020-09-26 NOTE — Telephone Encounter (Signed)
-----   Message from Berniece Salines, DO sent at 09/23/2020 10:29 AM EDT ----- Yes she is cleared for knee surgery ----- Message ----- From: Darrel Reach, CMA Sent: 09/18/2020   5:31 PM EDT To: Berniece Salines, DO  Patient notified of test results, patient asked if your are clearing her for knee surgery?  ----- Message ----- From: Berniece Salines, DO Sent: 09/17/2020   8:44 PM EDT To: Orvan July, RN  The echo showed that the heart is not fully relaxing like it should ( diastolic dysfunction) ,but otherwise normal. I will discuss it at the next office visit.

## 2020-09-26 NOTE — Telephone Encounter (Signed)
Patient notified

## 2020-10-08 ENCOUNTER — Ambulatory Visit: Payer: Self-pay | Admitting: Orthopaedic Surgery

## 2020-10-21 ENCOUNTER — Ambulatory Visit (INDEPENDENT_AMBULATORY_CARE_PROVIDER_SITE_OTHER): Payer: Self-pay | Admitting: Orthopaedic Surgery

## 2020-10-21 ENCOUNTER — Ambulatory Visit: Payer: Self-pay

## 2020-10-21 ENCOUNTER — Other Ambulatory Visit: Payer: Self-pay

## 2020-10-21 ENCOUNTER — Ambulatory Visit (INDEPENDENT_AMBULATORY_CARE_PROVIDER_SITE_OTHER): Payer: Self-pay

## 2020-10-21 VITALS — Ht 66.0 in | Wt 250.0 lb

## 2020-10-21 DIAGNOSIS — M25561 Pain in right knee: Secondary | ICD-10-CM

## 2020-10-21 DIAGNOSIS — M1711 Unilateral primary osteoarthritis, right knee: Secondary | ICD-10-CM

## 2020-10-21 DIAGNOSIS — G8929 Other chronic pain: Secondary | ICD-10-CM

## 2020-10-21 DIAGNOSIS — M25562 Pain in left knee: Secondary | ICD-10-CM

## 2020-10-21 NOTE — Progress Notes (Signed)
Office Visit Note   Patient: Autumn [name]           Date of Birth: 1965-08-20           MRN: [id number] Visit Date: 10/21/2020              Requested by: Reita May, NP [address] [place],  [place] [address] PCP: Reita May, NP   Assessment & Plan: Visit Diagnoses:  1. Chronic pain of left knee   2. Primary osteoarthritis of right knee     Plan: In terms of the right knee she does have advanced DJD and we had a long discussion regarding treatment options including weight loss, medications, injections, total knee replacement should conservative measures not provide relief.  In terms of the left knee with a recent injury my suspicion is for medial meniscal tear.  We will order MRI of the left knee to evaluate for this and have her follow-up afterwards.  Follow-Up Instructions: Return for after MRI.   Orders:  Orders Placed This Encounter  Procedures   XR KNEE 3 VIEW LEFT   XR KNEE 3 VIEW RIGHT   MR Knee Left w/o contrast   No orders of the defined types were placed in this encounter.     Procedures: No procedures performed   Clinical Data: No additional findings.   Subjective: Chief Complaint  Patient presents with   Right Knee - Pain   Left Knee - Pain    Autumn [name] is a very pleasant 55 year old female who comes in for evaluation of bilateral knee pain worse on the left.  In terms of the right knee she has had aching severe pain for years and has been treating this with Tylenol arthritis medications.  She was told previously had aspirin that she needed a knee replacement on the right side however her left knee actually feels worse.  She had a recent injury a couple weeks ago when she twisted the knee and since then she has had significant pain to the medial side of the knee with weightbearing and standing.  Denies any mechanical symptoms.   Review of Systems  Constitutional: Negative.   HENT: Negative.    Eyes: Negative.   Respiratory: Negative.     Cardiovascular: Negative.   Endocrine: Negative.   Musculoskeletal: Negative.   Neurological: Negative.   Hematological: Negative.   Psychiatric/Behavioral: Negative.    All other systems reviewed and are negative.   Objective: Vital Signs: Ht '5\' 6"'$  (1.676 m)   Wt 250 lb (113.4 kg)   BMI 40.35 kg/m   Physical Exam Vitals and nursing note reviewed.  Constitutional:      Appearance: She is well-developed.  HENT:     Head: Normocephalic and atraumatic.  Pulmonary:     Effort: Pulmonary effort is normal.  Abdominal:     Palpations: Abdomen is soft.  Musculoskeletal:     Cervical back: Neck supple.  Skin:    General: Skin is warm.     Capillary Refill: Capillary refill takes less than 2 seconds.  Neurological:     Mental Status: She is alert and oriented to person, place, and time.  Psychiatric:        Behavior: Behavior normal.        Thought Content: Thought content normal.        Judgment: Judgment normal.    Ortho Exam Left knee examination shows trace effusion.  Exquisite medial joint tenderness with positive McMurray.  Pain to  the medial side the knee past 90 degrees of flexion.  Because her cruciates are stable.  Right knee examination shows 1+ crepitus with range of motion which is mildly limited secondary to pain.  Collateral cruciates are stable.  No significant joint line tenderness.  Negative McMurray.  Specialty Comments:  No specialty comments available.  Imaging: XR KNEE 3 VIEW LEFT  Result Date: 10/21/2020 Mild to moderate osteoarthritis with periarticular spurring.  Joint spaces appear to be well preserved.  XR KNEE 3 VIEW RIGHT  Result Date: 10/21/2020 Advanced tricompartmental DJD worse in the medial compartment with slight varus alignment    PMFS History: Patient Active Problem List   Diagnosis Date Noted   Lower abdominal pain 08/25/2020   Hx of adenomatous colonic polyps 06/23/2012   Chest pain on exertion 04/16/2012   Morbid obesity with  BMI of 40.0-44.9, adult ([place]) 04/16/2012   Abdominal pain, acute, epigastric 04/16/2012   Past Medical History:  Diagnosis Date   Abdominal pain, acute, epigastric 04/16/2012   Arthritis    Chest pain on exertion 04/16/2012   Diverticulitis    last episode the sat before thanksgiving   Diverticulosis    Gallstones    Hx of adenomatous colonic polyps 06/23/2012   March 2012 - ascending colon polyp 1.2 cm = tubular adenoma (repeat March 2015)   Hypertension    Hypothyroidism    Kidney stones    Lower abdominal pain 08/25/2020   Morbid obesity with BMI of 40.0-44.9, adult ([place]) 04/16/2012   Osteoarthritis     Family History  Problem Relation Age of Onset   Hypertension Mother    Diabetes Mother    Lymphoma Mother    Thyroid disease Mother    Lung cancer Father    Heart murmur Brother    Pancreatic cancer Paternal Aunt    Cancer Paternal Aunt        unknown type   Stomach cancer Paternal Uncle    Colon cancer Neg Hx     Past Surgical History:  Procedure Laterality Date   bladder tack  02/15/2005   CHOLECYSTECTOMY     LAPAROSCOPIC TOTAL HYSTERECTOMY  2006   Social History   Occupational History   Occupation: ASSOCIATE    Employer: [employer]  Tobacco Use   Smoking status: Never   Smokeless tobacco: Never  Substance and Sexual Activity   Alcohol use: No   Drug use: No   Sexual activity: Not on file

## 2020-11-03 ENCOUNTER — Ambulatory Visit
Admission: RE | Admit: 2020-11-03 | Discharge: 2020-11-03 | Disposition: A | Discharge status: Home / Self Care | Payer: No Typology Code available for payment source | Source: Ambulatory Visit | Attending: Orthopaedic Surgery | Admitting: Orthopaedic Surgery

## 2020-11-03 ENCOUNTER — Other Ambulatory Visit: Payer: Self-pay

## 2020-11-03 DIAGNOSIS — G8929 Other chronic pain: Secondary | ICD-10-CM

## 2020-11-06 ENCOUNTER — Encounter: Payer: Self-pay | Admitting: Orthopaedic Surgery

## 2020-11-06 ENCOUNTER — Ambulatory Visit (INDEPENDENT_AMBULATORY_CARE_PROVIDER_SITE_OTHER): Payer: Self-pay | Admitting: Orthopaedic Surgery

## 2020-11-06 ENCOUNTER — Other Ambulatory Visit: Payer: Self-pay

## 2020-11-06 DIAGNOSIS — S83242A Other tear of medial meniscus, current injury, left knee, initial encounter: Secondary | ICD-10-CM

## 2020-11-06 DIAGNOSIS — M545 Low back pain, unspecified: Secondary | ICD-10-CM

## 2020-11-06 HISTORY — DX: Other tear of medial meniscus, current injury, left knee, initial encounter: S83.242A

## 2020-11-06 MED ORDER — PREDNISONE 10 MG (21) PO TBPK: ORAL_TABLET | ORAL | 0 refills | Status: DC

## 2020-11-06 NOTE — Progress Notes (Signed)
Office Visit Note   Patient: Autumn [name]           Date of Birth: 1965-06-08           MRN: [id number] Visit Date: 11/06/2020              Requested by: Reita May, NP [address] [place],  [place] [address] PCP: Reita May, NP   Assessment & Plan: Visit Diagnoses:  1. Acute medial meniscus tear of left knee, initial encounter   2. Acute left-sided low back pain without sciatica     Plan: MRI reviewed today which does show a displaced tear of the medial meniscus with slight extrusion.  Based on these findings I recommended arthroscopic partial medial meniscectomy.  She understands that there is chondromalacia of the medial compartment which lends to some of the pain but I feel that the majority of her symptoms are mechanical and related to the meniscal tear.  She is agreeable to moving forward with the knee scope in the near future.  Risk benefits rehab recovery of the knee surgery reviewed with the patient in detail.  For the back pain will send in a prednisone Dosepak for her.  Follow-Up Instructions: No follow-ups on file.   Orders:  No orders of the defined types were placed in this encounter.  Meds ordered this encounter  Medications   predniSONE (STERAPRED UNI-PAK 21 TAB) 10 MG (21) TBPK tablet    Sig: Take as directed    Dispense:  21 tablet    Refill:  0      Procedures: No procedures performed   Clinical Data: No additional findings.   Subjective: Chief Complaint  Patient presents with   Left Knee - Pain    HPI  Autumn [name] returns today to review MRI of the left knee.  Continues to have mechanical symptoms of catching and locking especially when she changes directions.  Review of Systems   Objective: Vital Signs: There were no vitals taken for this visit.  Physical Exam  Ortho Exam  Left knee exam shows positive McMurray and pain at the medial joint line.  Exam is otherwise stable.  Lumbar spine exam is nonfocal.  No motor or  sensory deficits in lower extremities.  Slight tenderness to the left paraspinous muscles.  Specialty Comments:  No specialty comments available.  Imaging: No results found.   PMFS History: Patient Active Problem List   Diagnosis Date Noted   Acute medial meniscus tear of left knee 11/06/2020   Lower abdominal pain 08/25/2020   Hx of adenomatous colonic polyps 06/23/2012   Chest pain on exertion 04/16/2012   Morbid obesity with BMI of 40.0-44.9, adult ([place]) 04/16/2012   Abdominal pain, acute, epigastric 04/16/2012   Past Medical History:  Diagnosis Date   Abdominal pain, acute, epigastric 04/16/2012   Arthritis    Chest pain on exertion 04/16/2012   Diverticulitis    last episode the sat before thanksgiving   Diverticulosis    Gallstones    Hx of adenomatous colonic polyps 06/23/2012   March 2012 - ascending colon polyp 1.2 cm = tubular adenoma (repeat March 2015)   Hypertension    Hypothyroidism    Kidney stones    Lower abdominal pain 08/25/2020   Morbid obesity with BMI of 40.0-44.9, adult ([place]) 04/16/2012   Osteoarthritis     Family History  Problem Relation Age of Onset   Hypertension Mother    Diabetes Mother    Lymphoma Mother  Thyroid disease Mother    Lung cancer Father    Heart murmur Brother    Pancreatic cancer Paternal Aunt    Cancer Paternal Aunt        unknown type   Stomach cancer Paternal Uncle    Colon cancer Neg Hx     Past Surgical History:  Procedure Laterality Date   bladder tack  02/15/2005   CHOLECYSTECTOMY     LAPAROSCOPIC TOTAL HYSTERECTOMY  2006   Social History   Occupational History   Occupation: ASSOCIATE    Employer: [employer]  Tobacco Use   Smoking status: Never   Smokeless tobacco: Never  Substance and Sexual Activity   Alcohol use: No   Drug use: No   Sexual activity: Not on file

## 2020-11-11 ENCOUNTER — Encounter (HOSPITAL_BASED_OUTPATIENT_CLINIC_OR_DEPARTMENT_OTHER): Payer: Self-pay | Admitting: Orthopaedic Surgery

## 2020-11-11 ENCOUNTER — Other Ambulatory Visit: Payer: Self-pay

## 2020-11-13 ENCOUNTER — Encounter (HOSPITAL_BASED_OUTPATIENT_CLINIC_OR_DEPARTMENT_OTHER)
Admission: RE | Admit: 2020-11-13 | Discharge: 2020-11-13 | Disposition: A | Discharge status: Home / Self Care | Payer: No Typology Code available for payment source | Source: Ambulatory Visit | Attending: Orthopaedic Surgery | Admitting: Orthopaedic Surgery

## 2020-11-13 DIAGNOSIS — Z01812 Encounter for preprocedural laboratory examination: Secondary | ICD-10-CM | POA: Insufficient documentation

## 2020-11-13 LAB — BASIC METABOLIC PANEL
Anion gap: 12 (ref 5–15)
BUN: 15 mg/dL (ref 6–20)
CO2: 23 mmol/L (ref 22–32)
Calcium: 9.2 mg/dL (ref 8.9–10.3)
Chloride: 102 mmol/L (ref 98–111)
Creatinine, Ser: 0.8 mg/dL (ref 0.44–1.00)
GFR, Estimated: >60 mL/min (ref >60)
Glucose, Bld: 75 mg/dL (ref 70–99)
Potassium: 4.5 mmol/L (ref 3.5–5.1)
Sodium: 137 mmol/L (ref 135–145)

## 2020-11-13 NOTE — Progress Notes (Signed)

## 2020-11-19 ENCOUNTER — Ambulatory Visit (HOSPITAL_BASED_OUTPATIENT_CLINIC_OR_DEPARTMENT_OTHER): Payer: No Typology Code available for payment source | Admitting: Anesthesiology

## 2020-11-19 ENCOUNTER — Other Ambulatory Visit: Payer: Self-pay

## 2020-11-19 ENCOUNTER — Ambulatory Visit (HOSPITAL_BASED_OUTPATIENT_CLINIC_OR_DEPARTMENT_OTHER)
Admission: RE | Admit: 2020-11-19 | Discharge: 2020-11-19 | Disposition: A | Discharge status: Home / Self Care | Payer: No Typology Code available for payment source | Attending: Orthopaedic Surgery | Admitting: Orthopaedic Surgery

## 2020-11-19 ENCOUNTER — Encounter (HOSPITAL_BASED_OUTPATIENT_CLINIC_OR_DEPARTMENT_OTHER): Payer: Self-pay | Admitting: Orthopaedic Surgery

## 2020-11-19 ENCOUNTER — Encounter (HOSPITAL_BASED_OUTPATIENT_CLINIC_OR_DEPARTMENT_OTHER)
Admission: RE | Disposition: A | Discharge status: Home / Self Care | Payer: Self-pay | Source: Home / Self Care | Attending: Orthopaedic Surgery

## 2020-11-19 DIAGNOSIS — X58XXXA Exposure to other specified factors, initial encounter: Secondary | ICD-10-CM | POA: Insufficient documentation

## 2020-11-19 DIAGNOSIS — Z79899 Other long term (current) drug therapy: Secondary | ICD-10-CM | POA: Insufficient documentation

## 2020-11-19 DIAGNOSIS — S83242A Other tear of medial meniscus, current injury, left knee, initial encounter: Secondary | ICD-10-CM | POA: Insufficient documentation

## 2020-11-19 DIAGNOSIS — M659 Synovitis and tenosynovitis, unspecified: Secondary | ICD-10-CM | POA: Insufficient documentation

## 2020-11-19 DIAGNOSIS — Z7952 Long term (current) use of systemic steroids: Secondary | ICD-10-CM | POA: Insufficient documentation

## 2020-11-19 DIAGNOSIS — M94262 Chondromalacia, left knee: Secondary | ICD-10-CM | POA: Insufficient documentation

## 2020-11-19 HISTORY — DX: Prediabetes: R73.03

## 2020-11-19 HISTORY — DX: Personal history of urinary calculi: Z87.442

## 2020-11-19 HISTORY — PX: KNEE ARTHROSCOPY WITH MENISCAL REPAIR: SHX5653

## 2020-11-19 LAB — GLUCOSE, CAPILLARY: Glucose-Capillary: 102 mg/dL — ABNORMAL HIGH (ref 70–99)

## 2020-11-19 SURGERY — ARTHROSCOPY, KNEE, WITH MENISCUS REPAIR
Anesthesia: General | Site: Knee | Laterality: Left

## 2020-11-19 MED ORDER — ACETAMINOPHEN 160 MG/5ML PO SOLN: 325.0000 mg | ORAL | Status: DC | PRN

## 2020-11-19 MED ORDER — CEFAZOLIN SODIUM-DEXTROSE 2-4 GM/100ML-% IV SOLN
INTRAVENOUS | Status: AC
  Filled 2020-11-19: qty 100

## 2020-11-19 MED ORDER — HYDROCODONE-ACETAMINOPHEN 5-325 MG PO TABS: 1.0000 | ORAL_TABLET | Freq: Four times a day (QID) | ORAL | 0 refills | Status: DC | PRN

## 2020-11-19 MED ORDER — CEFAZOLIN SODIUM-DEXTROSE 2-4 GM/100ML-% IV SOLN
2.0000 g | INTRAVENOUS | Status: AC
  Administered 2020-11-19: 2 g via INTRAVENOUS

## 2020-11-19 MED ORDER — MEPERIDINE HCL 25 MG/ML IJ SOLN: 6.2500 mg | INTRAMUSCULAR | Status: DC | PRN

## 2020-11-19 MED ORDER — MIDAZOLAM HCL 2 MG/2ML IJ SOLN
INTRAMUSCULAR | Status: AC
  Filled 2020-11-19: qty 2

## 2020-11-19 MED ORDER — FENTANYL CITRATE (PF) 100 MCG/2ML IJ SOLN
INTRAMUSCULAR | Status: AC
  Filled 2020-11-19: qty 2

## 2020-11-19 MED ORDER — ONDANSETRON HCL 4 MG/2ML IJ SOLN: 4.0000 mg | Freq: Once | INTRAMUSCULAR | Status: DC | PRN

## 2020-11-19 MED ORDER — FENTANYL CITRATE (PF) 100 MCG/2ML IJ SOLN
INTRAMUSCULAR | Status: DC | PRN
  Administered 2020-11-19 (×2): 50 ug via INTRAVENOUS

## 2020-11-19 MED ORDER — MIDAZOLAM HCL 5 MG/5ML IJ SOLN
INTRAMUSCULAR | Status: DC | PRN
Start: 2020-11-19 — End: 2020-11-19
  Administered 2020-11-19: 2 mg via INTRAVENOUS

## 2020-11-19 MED ORDER — BUPIVACAINE HCL (PF) 0.25 % IJ SOLN
INTRAMUSCULAR | Status: AC
  Filled 2020-11-19: qty 30

## 2020-11-19 MED ORDER — DEXAMETHASONE SODIUM PHOSPHATE 10 MG/ML IJ SOLN
INTRAMUSCULAR | Status: DC | PRN
  Administered 2020-11-19: 10 mg via INTRAVENOUS

## 2020-11-19 MED ORDER — OXYCODONE HCL 5 MG PO TABS: 5.0000 mg | ORAL_TABLET | Freq: Once | ORAL | Status: DC | PRN

## 2020-11-19 MED ORDER — OXYCODONE HCL 5 MG/5ML PO SOLN: 5.0000 mg | Freq: Once | ORAL | Status: DC | PRN

## 2020-11-19 MED ORDER — LIDOCAINE HCL 1 % IJ SOLN
INTRAMUSCULAR | Status: DC | PRN
  Administered 2020-11-19: 60 mg via INTRADERMAL

## 2020-11-19 MED ORDER — LACTATED RINGERS IV SOLN: INTRAVENOUS | Status: DC

## 2020-11-19 MED ORDER — PROPOFOL 10 MG/ML IV BOLUS
INTRAVENOUS | Status: DC | PRN
  Administered 2020-11-19: 200 mg via INTRAVENOUS

## 2020-11-19 MED ORDER — BUPIVACAINE HCL (PF) 0.25 % IJ SOLN
INTRAMUSCULAR | Status: DC | PRN
  Administered 2020-11-19: 10 mL

## 2020-11-19 MED ORDER — ONDANSETRON HCL 4 MG PO TABS: 4.0000 mg | ORAL_TABLET | Freq: Three times a day (TID) | ORAL | 0 refills | Status: DC | PRN

## 2020-11-19 MED ORDER — ACETAMINOPHEN 325 MG PO TABS: 325.0000 mg | ORAL_TABLET | ORAL | Status: DC | PRN

## 2020-11-19 MED ORDER — ONDANSETRON HCL 4 MG/2ML IJ SOLN
INTRAMUSCULAR | Status: DC | PRN
  Administered 2020-11-19: 4 mg via INTRAVENOUS

## 2020-11-19 MED ORDER — FENTANYL CITRATE (PF) 100 MCG/2ML IJ SOLN: 25.0000 ug | INTRAMUSCULAR | Status: DC | PRN

## 2020-11-19 SURGICAL SUPPLY — 33 items
BANDAGE ESMARK 6X9 LF (GAUZE/BANDAGES/DRESSINGS) IMPLANT
BLADE EXCALIBUR 4.0X13 (MISCELLANEOUS) IMPLANT
BLADE SHAVER TORPEDO 4X13 (MISCELLANEOUS) IMPLANT
BNDG CMPR 9X6 STRL LF SNTH (GAUZE/BANDAGES/DRESSINGS)
BNDG ELASTIC 6X5.8 VLCR STR LF (GAUZE/BANDAGES/DRESSINGS) ×4 IMPLANT
BNDG ESMARK 6X9 LF (GAUZE/BANDAGES/DRESSINGS)
COOLER ICEMAN CLASSIC (MISCELLANEOUS) ×2 IMPLANT
CUFF TOURN SGL QUICK 34 (TOURNIQUET CUFF) ×2
CUFF TRNQT CYL 34X4.125X (TOURNIQUET CUFF) ×1 IMPLANT
DRAPE ARTHROSCOPY W/POUCH 90 (DRAPES) ×2 IMPLANT
DRAPE IMP U-DRAPE 54X76 (DRAPES) ×2 IMPLANT
DRAPE U-SHAPE 47X51 STRL (DRAPES) ×2 IMPLANT
DURAPREP 26ML APPLICATOR (WOUND CARE) ×2 IMPLANT
GAUZE SPONGE 4X4 12PLY STRL (GAUZE/BANDAGES/DRESSINGS) ×2 IMPLANT
GAUZE XEROFORM 1X8 LF (GAUZE/BANDAGES/DRESSINGS) ×2 IMPLANT
GLOVE SURG NEOP MICRO LF SZ7.5 (GLOVE) ×2 IMPLANT
GLOVE SURG SYN 7.5  E (GLOVE) ×2
GLOVE SURG SYN 7.5 E (GLOVE) ×1 IMPLANT
GLOVE SURG UNDER POLY LF SZ7 (GLOVE) ×2 IMPLANT
GLOVE SURG UNDER POLY LF SZ7.5 (GLOVE) ×2 IMPLANT
GOWN STRL REIN XL XLG (GOWN DISPOSABLE) ×2 IMPLANT
GOWN STRL REUS W/ TWL LRG LVL3 (GOWN DISPOSABLE) ×1 IMPLANT
GOWN STRL REUS W/ TWL XL LVL3 (GOWN DISPOSABLE) ×1 IMPLANT
GOWN STRL REUS W/TWL LRG LVL3 (GOWN DISPOSABLE) ×2
GOWN STRL REUS W/TWL XL LVL3 (GOWN DISPOSABLE) ×2
MANIFOLD NEPTUNE II (INSTRUMENTS) ×2 IMPLANT
PACK ARTHROSCOPY DSU (CUSTOM PROCEDURE TRAY) ×2 IMPLANT
PACK BASIN DAY SURGERY FS (CUSTOM PROCEDURE TRAY) ×2 IMPLANT
PAD COLD SHLDR WRAP-ON (PAD) ×2 IMPLANT
SHEET MEDIUM DRAPE 40X70 STRL (DRAPES) ×2 IMPLANT
SUT ETHILON 3 0 PS 1 (SUTURE) ×2 IMPLANT
TOWEL GREEN STERILE FF (TOWEL DISPOSABLE) ×2 IMPLANT
TUBING ARTHROSCOPY IRRIG 16FT (MISCELLANEOUS) ×2 IMPLANT

## 2020-11-19 NOTE — Anesthesia Procedure Notes (Signed)
Procedure Name: LMA Insertion Date/Time: 11/19/2020 12:27 PM Performed by: Willa Frater, CRNA Pre-anesthesia Checklist: Patient identified, Emergency Drugs available, Suction available and Patient being monitored Patient Re-evaluated:Patient Re-evaluated prior to induction Oxygen Delivery Method: Circle system utilized Preoxygenation: Pre-oxygenation with 100% oxygen Induction Type: IV induction Ventilation: Mask ventilation without difficulty LMA: LMA inserted LMA Size: 4.0 Number of attempts: 1 Airway Equipment and Method: Bite block Placement Confirmation: positive ETCO2 Tube secured with: Tape Dental Injury: Teeth and Oropharynx as per pre-operative assessment

## 2020-11-19 NOTE — H&P (Signed)
PREOPERATIVE H&P  Chief Complaint: left medial meniscal tear  HPI: Autumn [name] is a 55 y.o. female who presents for surgical treatment of left medial meniscal tear.  She denies any changes in medical history.  Past Medical History:  Diagnosis Date   Abdominal pain, acute, epigastric 04/16/2012   Arthritis    Chest pain on exertion 04/16/2012   Diverticulitis    last episode the sat before thanksgiving   Diverticulosis    Gallstones    History of kidney stones    Hx of adenomatous colonic polyps 06/23/2012   March 2012 - ascending colon polyp 1.2 cm = tubular adenoma (repeat March 2015)   Hypertension    Lower abdominal pain 08/25/2020   Morbid obesity with BMI of 40.0-44.9, adult ([place]) 04/16/2012   Osteoarthritis    Pre-diabetes    Past Surgical History:  Procedure Laterality Date   bladder tack  02/15/2005   CHOLECYSTECTOMY     LAPAROSCOPIC TOTAL HYSTERECTOMY  2006   Social History   Socioeconomic History   Marital status: Married    Spouse name: Not on file   Number of children: 1   Years of education: Not on file   Highest education level: Not on file  Occupational History   Occupation: ASSOCIATE    Employer: [id number]  Tobacco Use   Smoking status: Never   Smokeless tobacco: Never  Substance and Sexual Activity   Alcohol use: No   Drug use: No   Sexual activity: Not on file  Other Topics Concern   Not on file  Social History Narrative   Not on file   Social Determinants of Health   Financial Resource Strain: Not on file  Food Insecurity: Not on file  Transportation Needs: Not on file  Physical Activity: Not on file  Stress: Not on file  Social Connections: Not on file   Family History  Problem Relation Age of Onset   Hypertension Mother    Diabetes Mother    Lymphoma Mother    Thyroid disease Mother    Lung cancer Father    Heart murmur Brother    Pancreatic cancer Paternal Aunt    Cancer Paternal Aunt        unknown type   Stomach  cancer Paternal Uncle    Colon cancer Neg Hx    No Known Allergies Prior to Admission medications   Medication Sig Start Date End Date Taking? Authorizing Provider  acetaminophen (TYLENOL) 325 MG tablet Take 650 mg by mouth every 6 (six) hours as needed for mild pain.   Yes [provider]  cyclobenzaprine (FLEXERIL) 5 MG tablet Take 5 mg by mouth 3 (three) times daily as needed for muscle spasms.   Yes [provider]  diclofenac (VOLTAREN) 75 MG EC tablet Take 75 mg by mouth 2 (two) times daily as needed for moderate pain.   Yes [provider]  diclofenac Sodium (VOLTAREN) 1 % GEL Apply 2 g topically 4 (four) times daily as needed for pain.   Yes [provider]  hydrochlorothiazide (HYDRODIURIL) 25 MG tablet Take 25 mg by mouth daily.   Yes [provider]  predniSONE (STERAPRED UNI-PAK 21 TAB) 10 MG (21) TBPK tablet Take as directed 11/06/20  Yes Leandrew Koyanagi, MD  Vitamin D, Ergocalciferol, (DRISDOL) 1.25 MG (50000 UNIT) CAPS capsule Take 1 capsule (50,000 Units total) by mouth once a week. Take for 12 weeks 08/29/20  Yes Tobb, Kardie, DO     Positive  ROS: All other systems have been reviewed and were otherwise negative with the exception of those mentioned in the HPI and as above.  Physical Exam: General: Alert, no acute distress Cardiovascular: No pedal edema Respiratory: No cyanosis, no use of accessory musculature GI: abdomen soft Skin: No lesions in the area of chief complaint Neurologic: Sensation intact distally Psychiatric: Patient is competent for consent with normal mood and affect Lymphatic: no lymphedema  MUSCULOSKELETAL: exam stable  Assessment: left medial meniscal tear  Plan: Plan for Procedure(s): LEFT KNEE ARTHROSCOPY PARTIAL MEDIAL MENISCECTOMY  The risks benefits and alternatives were discussed with the patient including but not limited to the risks of nonoperative treatment, versus surgical intervention  including infection, bleeding, nerve injury,  blood clots, cardiopulmonary complications, morbidity, mortality, among others, and they were willing to proceed.   Preoperative templating of the joint replacement has been completed, documented, and submitted to the Operating Room personnel in order to optimize intra-operative equipment management.   Eduard Roux, MD 11/19/2020 10:27 AM

## 2020-11-19 NOTE — Discharge Instructions (Addendum)

## 2020-11-19 NOTE — Anesthesia Preprocedure Evaluation (Addendum)
Anesthesia Evaluation  Patient identified by MRN, date of birth, ID band Patient awake    Reviewed: Allergy & Precautions, H&P , NPO status , Patient's Chart, lab work & pertinent test results, reviewed documented beta blocker date and time   Airway Mallampati: II  TM Distance: >3 FB Neck ROM: full    Dental no notable dental hx. (+) Partial Upper, Poor Dentition, Chipped, Missing,    Pulmonary neg pulmonary ROS,    Pulmonary exam normal breath sounds clear to auscultation       Cardiovascular Exercise Tolerance: Good hypertension,  Rhythm:regular Rate:Normal  Echo 8/22 1. Left ventricular ejection fraction, by estimation, is 50 to 55%. The  left ventricle has low normal function. The left ventricle has no regional  wall motion abnormalities. There is mild concentric left ventricular  hypertrophy. Left ventricular  diastolic parameters are consistent with Grade I diastolic dysfunction  (impaired relaxation).  2. Right ventricular systolic function is normal. The right ventricular  size is normal. There is normal pulmonary artery systolic pressure.  3. The mitral valve is normal in structure. No evidence of mitral valve  regurgitation. No evidence of mitral stenosis.  4. The aortic valve is tricuspid. Aortic valve regurgitation is not  visualized. No aortic stenosis is present.  5. The inferior vena cava is normal in size with greater than 50%  respiratory variability, suggesting right atrial pressure of 3 mmHg.   Myoview 7/22 The left ventricular ejection fraction is normal (55-65%). Nuclear stress EF: 61%. There was no ST segment deviation noted during stress. The study is normal. This is a low risk study.    Neuro/Psych negative neurological ROS  negative psych ROS   GI/Hepatic negative GI ROS, Neg liver ROS,   Endo/Other  Morbid obesity  Renal/GU negative Renal ROS  negative genitourinary    Musculoskeletal  (+) Arthritis , Osteoarthritis,    Abdominal   Peds  Hematology negative hematology ROS (+)   Anesthesia Other Findings   Reproductive/Obstetrics negative OB ROS                            Anesthesia Physical Anesthesia Plan  ASA: 2  Anesthesia Plan: General   Post-op Pain Management:    Induction: Intravenous  PONV Risk Score and Plan: 3 and Ondansetron and Dexamethasone  Airway Management Planned: LMA  Additional Equipment: None  Intra-op Plan:   Post-operative Plan:   Informed Consent: I have reviewed the patients History and Physical, chart, labs and discussed the procedure including the risks, benefits and alternatives for the proposed anesthesia with the patient or authorized representative who has indicated his/her understanding and acceptance.     Dental Advisory Given  Plan Discussed with: CRNA and Anesthesiologist  Anesthesia Plan Comments: ( )        Anesthesia Quick Evaluation

## 2020-11-19 NOTE — Op Note (Signed)
   Surgery Date: 11/19/2020  PREOPERATIVE DIAGNOSES:  1. Left knee medial meniscus tear 2. Left knee synovitis  POSTOPERATIVE DIAGNOSES:  same  PROCEDURES PERFORMED:  1. Left knee arthroscopy with major synovectomy 2. Left knee arthroscopy with arthroscopic partial medial meniscectomy 3. Left knee arthroscopy with arthroscopic chondroplasty medial femoral condyle.  SURGEON: N. Eduard Roux, M.D.  ASSIST: Ciro Backer [place], [place]; necessary for the timely completion of procedure and due to complexity of procedure.  ANESTHESIA:  general  FLUIDS: Per anesthesia record.   ESTIMATED BLOOD LOSS: minimal  DESCRIPTION OF PROCEDURE: Autumn [name] is a 55 y.o.-year-old female with above mentioned conditions. Full discussion held regarding risks benefits alternatives and complications related surgical intervention. Conservative care options reviewed. All questions answered.  The patient was identified in the preoperative holding area and the operative extremity was marked. The patient was brought to the operating room and transferred to operating table in a supine position. Satisfactory general anesthesia was induced by anesthesiology.    Standard anterolateral, anteromedial arthroscopy portals were obtained. The anteromedial portal was obtained with a spinal needle for localization under direct visualization with subsequent diagnostic findings.   Diagnostic knee arthroscopy was first performed which revealed moderate to severe synovitis throughout the knee.  Major synovectomy was performed using oscillating shaver.  Hemostasis was obtained.  We then placed a valgus force on the knee to examine the medial compartment.  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  There were no unstable cartilage flaps.  We did find a large radial tear of the mid body of the medial meniscus.  Partial medial meniscectomy was performed with oscillating shaver back to stable  margins.  The ACL showed that she may have had a prior partial tear.  This was gently debrided back to stable margins.  The lateral compartment was unremarkable with some softening of the chondral surface.  Patellofemoral compartment demonstrated grade 3 changes.   Gutters were checked for loose bodies.  Excess fluid was removed from the knee joint.  Incisions were closed with interrupted nylon sutures.  Sterile dressings were applied.  Patient tolerated procedure well had no immediate complications.  Suprapatellar pouch and gutters: severe synovitis or debris. Patella chondral surface: Grade 3 Trochlear chondral surface: Grade 3 Patellofemoral tracking: slightly lateral Medial meniscus: radial tear midbody.  Medial femoral condyle weight bearing surface: Grade 3 Medial tibial plateau: Grade 3 Anterior cruciate ligament:chronic partial tear Posterior cruciate ligament:stable Lateral meniscus: normal.   Lateral femoral condyle weight bearing surface: Grade 1 Lateral tibial plateau: Grade 1  DISPOSITION: The patient was awakened from general anesthetic, extubated, taken to the recovery room in medically stable condition, no apparent complications. The patient may be weightbearing as tolerated to the operative lower extremity.  Range of motion of right knee as tolerated.  Autumn Cecil, MD [facility] 1:09 PM

## 2020-11-19 NOTE — Anesthesia Postprocedure Evaluation (Signed)
Anesthesia Post Note  Patient: Autumn [name]  Procedure(s) Performed: LEFT KNEE ARTHROSCOPY PARTIAL MEDIAL MENISCECTOMY (Left: Knee)     Patient location during evaluation: PACU Anesthesia Type: General Level of consciousness: awake and alert Pain management: pain level controlled Vital Signs Assessment: post-procedure vital signs reviewed and stable Respiratory status: spontaneous breathing, nonlabored ventilation, respiratory function stable and patient connected to nasal cannula oxygen Cardiovascular status: blood pressure returned to baseline and stable Postop Assessment: no apparent nausea or vomiting Anesthetic complications: no   No notable events documented.  Last Vitals:  Vitals:   11/19/20 1400 11/19/20 1425  BP: 132/83 113/67  Pulse: 61 (!) 58  Resp: 13 14  Temp:  (P) 36.4 C  SpO2: 94% 94%    Last Pain:  Vitals:   11/19/20 1425  TempSrc:   PainSc: 0-No pain                 Latasha Buczkowski

## 2020-11-19 NOTE — Transfer of Care (Signed)
Immediate Anesthesia Transfer of Care Note  Patient: Autumn [name]  Procedure(s) Performed: LEFT KNEE ARTHROSCOPY PARTIAL MEDIAL MENISCECTOMY (Left: Knee)  Patient Location: PACU  Anesthesia Type:General  Level of Consciousness: awake and drowsy  Airway & Oxygen Therapy: Patient Spontanous Breathing and Patient connected to face mask oxygen  Post-op Assessment: Report given to RN and Post -op Vital signs reviewed and stable  Post vital signs: Reviewed and stable  Last Vitals:  Vitals Value Taken Time  BP    Temp    Pulse    Resp    SpO2      Last Pain:  Vitals:   11/19/20 1021  TempSrc: Oral  PainSc: 0-No pain      Patients Stated Pain Goal: 4 (37/85/88 5027)  Complications: No notable events documented.

## 2020-11-20 ENCOUNTER — Encounter (HOSPITAL_BASED_OUTPATIENT_CLINIC_OR_DEPARTMENT_OTHER): Payer: Self-pay | Admitting: Orthopaedic Surgery

## 2020-11-26 ENCOUNTER — Other Ambulatory Visit: Payer: Self-pay

## 2020-11-26 ENCOUNTER — Ambulatory Visit (INDEPENDENT_AMBULATORY_CARE_PROVIDER_SITE_OTHER): Payer: No Typology Code available for payment source | Admitting: Orthopaedic Surgery

## 2020-11-26 ENCOUNTER — Encounter: Payer: Self-pay | Admitting: Orthopaedic Surgery

## 2020-11-26 DIAGNOSIS — S83242A Other tear of medial meniscus, current injury, left knee, initial encounter: Secondary | ICD-10-CM

## 2020-11-26 NOTE — Progress Notes (Signed)
Post-Op Visit Note   Patient: Autumn [name]           Date of Birth: July 18, 1965           MRN: [id number] Visit Date: 11/26/2020 PCP: Reita May, NP   Assessment & Plan:  Chief Complaint:  Chief Complaint  Patient presents with   Left Knee - Pain   Visit Diagnoses:  1. Acute medial meniscus tear of left knee, initial encounter     Plan: [place] is 1 week status post left knee arthroscopy partial medial meniscectomy and chondroplasty.  She is overall doing well and describes some burning pain.  Her range of motion has improved as well as her ambulation.  Left knee surgical incisions are healed.  No signs of infection.  Minimal swelling.  She is able to flex to greater than 90 degrees.  There is no calf tenderness or swelling.  Sutures were removed today.  We provided postarthroscopy knee exercises.  I reviewed the arthroscopy pictures with her and her husband.  She can gradually increase activity as tolerated.  Recheck in 4 weeks.  Follow-Up Instructions: Return in about 4 weeks (around 12/24/2020).   Orders:  No orders of the defined types were placed in this encounter.  No orders of the defined types were placed in this encounter.   Imaging: No results found.  PMFS History: Patient Active Problem List   Diagnosis Date Noted   Acute medial meniscus tear of left knee 11/06/2020   Lower abdominal pain 08/25/2020   Hx of adenomatous colonic polyps 06/23/2012   Chest pain on exertion 04/16/2012   Morbid obesity with BMI of 40.0-44.9, adult ([place]) 04/16/2012   Abdominal pain, acute, epigastric 04/16/2012   Past Medical History:  Diagnosis Date   Abdominal pain, acute, epigastric 04/16/2012   Arthritis    Chest pain on exertion 04/16/2012   Diverticulitis    last episode the sat before thanksgiving   Diverticulosis    Gallstones    History of kidney stones    Hx of adenomatous colonic polyps 06/23/2012   March 2012 - ascending colon polyp 1.2 cm = tubular adenoma  (repeat March 2015)   Hypertension    Lower abdominal pain 08/25/2020   Morbid obesity with BMI of 40.0-44.9, adult ([place]) 04/16/2012   Osteoarthritis    Pre-diabetes     Family History  Problem Relation Age of Onset   Hypertension Mother    Diabetes Mother    Lymphoma Mother    Thyroid disease Mother    Lung cancer Father    Heart murmur Brother    Pancreatic cancer Paternal Aunt    Cancer Paternal Aunt        unknown type   Stomach cancer Paternal Uncle    Colon cancer Neg Hx     Past Surgical History:  Procedure Laterality Date   bladder tack  02/15/2005   CHOLECYSTECTOMY     KNEE ARTHROSCOPY WITH MENISCAL REPAIR Left 11/19/2020   Procedure: LEFT KNEE ARTHROSCOPY PARTIAL MEDIAL MENISCECTOMY;  Surgeon: Leandrew Koyanagi, MD;  Location: [place];  Service: Orthopedics;  Laterality: Left;   LAPAROSCOPIC TOTAL HYSTERECTOMY  2006   Social History   Occupational History   Occupation: ASSOCIATE    Employer: [employer]  Tobacco Use   Smoking status: Never   Smokeless tobacco: Never  Substance and Sexual Activity   Alcohol use: No   Drug use: No   Sexual activity: Not on file

## 2020-12-02 ENCOUNTER — Other Ambulatory Visit: Payer: Self-pay

## 2020-12-02 ENCOUNTER — Encounter: Payer: Self-pay | Admitting: Cardiology

## 2020-12-02 ENCOUNTER — Ambulatory Visit (INDEPENDENT_AMBULATORY_CARE_PROVIDER_SITE_OTHER): Payer: No Typology Code available for payment source | Admitting: Cardiology

## 2020-12-02 VITALS — BP 122/80 | HR 70 | Ht 67.0 in | Wt 250.0 lb

## 2020-12-02 DIAGNOSIS — I10 Essential (primary) hypertension: Secondary | ICD-10-CM | POA: Insufficient documentation

## 2020-12-02 DIAGNOSIS — Z6841 Body Mass Index (BMI) 40.0 and over, adult: Secondary | ICD-10-CM

## 2020-12-02 DIAGNOSIS — E782 Mixed hyperlipidemia: Secondary | ICD-10-CM

## 2020-12-02 DIAGNOSIS — R400 Somnolence: Secondary | ICD-10-CM | POA: Insufficient documentation

## 2020-12-02 DIAGNOSIS — R7303 Prediabetes: Secondary | ICD-10-CM

## 2020-12-02 MED ORDER — METOPROLOL SUCCINATE ER 25 MG PO TB24: 12.5000 mg | ORAL_TABLET | Freq: Every day | ORAL | 1 refills | Status: DC

## 2020-12-02 NOTE — Patient Instructions (Addendum)
Medication Instructions:  START Metoprolol Succinate (Toprol-XL) 12.5 mg daily   *If you need a refill on your cardiac medications before your next appointment, please call your pharmacy*  Lab Work: NONE ordered at this time of appointment   If you have labs (blood work) drawn today and your tests are completely normal, you will receive your results only by: [place] (if you have MyChart) OR A paper copy in the mail If you have any lab test that is abnormal or we need to change your treatment, we will call you to review the results.  Testing/Procedures: Your physician has recommended that you have a sleep study. This test records several body functions during sleep, including: brain activity, eye movement, oxygen and carbon dioxide blood levels, heart rate and rhythm, breathing rate and rhythm, the flow of air through your mouth and nose, snoring, body muscle movements, and chest and belly movement.    Follow-Up: At [facility], you and your health needs are our priority.  As part of our continuing mission to provide you with exceptional heart care, we have created designated Provider Care Teams.  These Care Teams include your primary Cardiologist (physician) and Advanced Practice Providers (APPs -  Physician Assistants and Nurse Practitioners) who all work together to provide you with the care you need, when you need it.   Your next appointment:   6 month(s)  The format for your next appointment:   In Person  Provider:   Berniece Salines, DO or APP   Other Instructions

## 2020-12-02 NOTE — Progress Notes (Signed)
Cardiology Office Note:    Date:  12/02/2020   ID:  Autumn [name], DOB May 15, 1965, MRN [id number]  PCP:  Reita May, NP  Cardiologist:  Berniece Salines, DO  Electrophysiologist:  None   Referring MD: Reita May, NP   Chief Complaint  Patient presents with   Follow-up    History of Present Illness:    Autumn [name] is a 55 y.o. female with a hx of prediabetes, hypertension, obesity is here today for follow-up visit.  Saw the patient back in July 2022 at that time she was experiencing shortness of breath as well as daytime somnolence intermittent .  During that visit she was preoperative so we therefore went ahead and did an nuclear stress test and echocardiogram.  Her nuclear stress test was normal she was able to proceed with her surgery.  She is status post her right knee surgery.  Since I saw the patient she has had some chest discomfort and shortness of breath has been taking.  She described the chest discomfort as a right-sided pain.  Which comes on and off.  Past Medical History:  Diagnosis Date   Abdominal pain, acute, epigastric 04/16/2012   Arthritis    Chest pain on exertion 04/16/2012   Diverticulitis    last episode the sat before thanksgiving   Diverticulosis    Gallstones    History of kidney stones    Hx of adenomatous colonic polyps 06/23/2012   March 2012 - ascending colon polyp 1.2 cm = tubular adenoma (repeat March 2015)   Hypertension    Lower abdominal pain 08/25/2020   Morbid obesity with BMI of 40.0-44.9, adult ([place]) 04/16/2012   Osteoarthritis    Pre-diabetes     Past Surgical History:  Procedure Laterality Date   bladder tack  02/15/2005   CHOLECYSTECTOMY     KNEE ARTHROSCOPY WITH MENISCAL REPAIR Left 11/19/2020   Procedure: LEFT KNEE ARTHROSCOPY PARTIAL MEDIAL MENISCECTOMY;  Surgeon: Leandrew Koyanagi, MD;  Location: [place];  Service: Orthopedics;  Laterality: Left;   LAPAROSCOPIC TOTAL HYSTERECTOMY  2006    Current  Medications: Current Meds  Medication Sig   acetaminophen (TYLENOL) 325 MG tablet Take 650 mg by mouth every 6 (six) hours as needed for mild pain.   cyclobenzaprine (FLEXERIL) 5 MG tablet Take 5 mg by mouth 3 (three) times daily as needed for muscle spasms.   diclofenac Sodium (VOLTAREN) 1 % GEL Apply 2 g topically 4 (four) times daily as needed for pain.   hydrochlorothiazide (HYDRODIURIL) 25 MG tablet Take 25 mg by mouth daily.   HYDROcodone-acetaminophen (NORCO) 5-325 MG tablet Take 1 tablet by mouth every 6 (six) hours as needed.   metoprolol succinate (TOPROL XL) 25 MG 24 hr tablet Take 0.5 tablets (12.5 mg total) by mouth daily.   Vitamin D, Ergocalciferol, (DRISDOL) 1.25 MG (50000 UNIT) CAPS capsule Take 1 capsule (50,000 Units total) by mouth once a week. Take for 12 weeks     Allergies:   Patient has no known allergies.   Social History   Socioeconomic History   Marital status: Married    Spouse name: Not on file   Number of children: 1   Years of education: Not on file   Highest education level: Not on file  Occupational History   Occupation: ASSOCIATE    Employer: [email or web address]  Tobacco Use   Smoking status: Never   Smokeless tobacco: Never  Substance and Sexual Activity   Alcohol use: No  Drug use: No   Sexual activity: Not on file  Other Topics Concern   Not on file  Social History Narrative   Not on file   Social Determinants of Health   Financial Resource Strain: Not on file  Food Insecurity: Not on file  Transportation Needs: Not on file  Physical Activity: Not on file  Stress: Not on file  Social Connections: Not on file     Family History: The patient's family history includes Cancer in her paternal aunt; Diabetes in her mother; Heart murmur in her brother; Hypertension in her mother; Lung cancer in her father; Lymphoma in her mother; Pancreatic cancer in her paternal aunt; Stomach cancer in her paternal uncle; Thyroid disease in her mother. There is no  history of Colon cancer.  ROS:   Review of Systems  Constitution: Negative for decreased appetite, fever and weight gain.  HENT: Negative for congestion, ear discharge, hoarse voice and sore throat.   Eyes: Negative for discharge, redness, vision loss in right eye and visual halos.  Cardiovascular: Negative for chest pain, dyspnea on exertion, leg swelling, orthopnea and palpitations.  Respiratory: Negative for cough, hemoptysis, shortness of breath and snoring.   Endocrine: Negative for heat intolerance and polyphagia.  Hematologic/Lymphatic: Negative for bleeding problem. Does not bruise/bleed easily.  Skin: Negative for flushing, nail changes, rash and suspicious lesions.  Musculoskeletal: Negative for arthritis, joint pain, muscle cramps, myalgias, neck pain and stiffness.  Gastrointestinal: Negative for abdominal pain, bowel incontinence, diarrhea and excessive appetite.  Genitourinary: Negative for decreased libido, genital sores and incomplete emptying.  Neurological: Negative for brief paralysis, focal weakness, headaches and loss of balance.  Psychiatric/Behavioral: Negative for altered mental status, depression and suicidal ideas.  Allergic/Immunologic: Negative for HIV exposure and persistent infections.    EKGs/Labs/Other Studies Reviewed:    The following studies were reviewed today:   EKG: None today   Echo August 2022 IMPRESSIONS     1. Left ventricular ejection fraction, by estimation, is 50 to 55%. The  left ventricle has low normal function. The left ventricle has no regional  wall motion abnormalities. There is mild concentric left ventricular  hypertrophy. Left ventricular  diastolic parameters are consistent with Grade I diastolic dysfunction  (impaired relaxation).   2. Right ventricular systolic function is normal. The right ventricular  size is normal. There is normal pulmonary artery systolic pressure.   3. The mitral valve is normal in structure. No  evidence of mitral valve  regurgitation. No evidence of mitral stenosis.   4. The aortic valve is tricuspid. Aortic valve regurgitation is not  visualized. No aortic stenosis is present.   5. The inferior vena cava is normal in size with greater than 50%  respiratory variability, suggesting right atrial pressure of 3 mmHg.   FINDINGS   Left Ventricle: Left ventricular ejection fraction, by estimation, is 50  to 55%. The left ventricle has low normal function. The left ventricle has  no regional wall motion abnormalities. Global longitudinal strain  performed but not reported based on  interpreter judgement due to suboptimal tracking. The left ventricular  internal cavity size was normal in size. There is mild concentric left  ventricular hypertrophy. Left ventricular diastolic parameters are  consistent with Grade I diastolic dysfunction   (impaired relaxation). Normal left ventricular filling pressure.   Right Ventricle: The right ventricular size is normal. No increase in  right ventricular wall thickness. Right ventricular systolic function is  normal. There is normal pulmonary artery systolic pressure.  The tricuspid  regurgitant velocity is 2.00 m/s, and   with an assumed right atrial pressure of 3 mmHg, the estimated right  ventricular systolic pressure is 81.0 mmHg.   Left Atrium: Left atrial size was normal in size.   Right Atrium: Right atrial size was normal in size.   Pericardium: There is no evidence of pericardial effusion.   Mitral Valve: The mitral valve is normal in structure. No evidence of  mitral valve regurgitation. No evidence of mitral valve stenosis.   Tricuspid Valve: The tricuspid valve is normal in structure. Tricuspid  valve regurgitation is trivial. No evidence of tricuspid stenosis.   Aortic Valve: The aortic valve is tricuspid. Aortic valve regurgitation is  not visualized. No aortic stenosis is present.   Pulmonic Valve: The pulmonic valve was  normal in structure. Pulmonic valve  regurgitation is not visualized. No evidence of pulmonic stenosis.   Aorta: The aortic root, ascending aorta, aortic arch and descending aorta  are all structurally normal, with no evidence of dilitation or  obstruction.   Venous: The pulmonary veins were not well visualized. The inferior vena  cava is normal in size with greater than 50% respiratory variability,  suggesting right atrial pressure of 3 mmHg.   IAS/Shunts: No atrial level shunt detected by color flow Doppler.   Nuclear stress test 2022 The left ventricular ejection fraction is normal (55-65%). Nuclear stress EF: 61%. There was no ST segment deviation noted during stress. The study is normal. This is a low risk study.   Recent Labs: 11/13/2020: BUN 15; Creatinine, Ser 0.80; Potassium 4.5; Sodium 137  Recent Lipid Panel    Component Value Date/Time   CHOL 183 04/16/2012 1050   TRIG 117 04/16/2012 1050   HDL 48 04/16/2012 1050   CHOLHDL 3.8 04/16/2012 1050   VLDL 23 04/16/2012 1050   LDLCALC 112 (H) 04/16/2012 1050    Physical Exam:    VS:  BP 122/80   Pulse 70   Ht 5\' 7"  (1.702 m)   Wt 250 lb (113.4 kg)   SpO2 98%   BMI 39.16 kg/m     Wt Readings from Last 3 Encounters:  12/02/20 250 lb (113.4 kg)  11/19/20 248 lb 7.3 oz (112.7 kg)  10/21/20 250 lb (113.4 kg)     GEN: Well nourished, well developed in no acute distress HEENT: Normal NECK: No JVD; No carotid bruits LYMPHATICS: No lymphadenopathy CARDIAC: S1S2 noted,RRR, no murmurs, rubs, gallops RESPIRATORY:  Clear to auscultation without rales, wheezing or rhonchi  ABDOMEN: Soft, non-tender, non-distended, +bowel sounds, no guarding. EXTREMITIES: No edema, No cyanosis, no clubbing MUSCULOSKELETAL:  No deformity  SKIN: Warm and dry NEUROLOGIC:  Alert and oriented x 3, non-focal PSYCHIATRIC:  Normal affect, good insight  ASSESSMENT:    1. Hypertension, unspecified type   2. Daytime somnolence   3. Morbid  obesity with BMI of 40.0-44.9, adult ([place])   4. Prediabetes   5. Mixed hyperlipidemia    PLAN:    She has low normal EF and with her chest discomfort we will put the patient on low-dose beta-blocker hopefully this will help.  She is agreeable to start this medication. She still have daytime somnolence and her snoring she has not been able to get her sleep study we will reorder her sleep study today. Follow-up in 6 months. Her foot surgery is pending from a cardiovascular standpoint the patient can be able to get this procedure done. The patient understands the need to lose weight with  diet and exercise. We have discussed specific strategies for this.  The patient is in agreement with the above plan. The patient left the office in stable condition.  The patient will follow up in 6 months or sooner if needed   Medication Adjustments/Labs and Tests Ordered: Current medicines are reviewed at length with the patient today.  Concerns regarding medicines are outlined above.  Orders Placed This Encounter  Procedures   Split night study   Meds ordered this encounter  Medications   metoprolol succinate (TOPROL XL) 25 MG 24 hr tablet    Sig: Take 0.5 tablets (12.5 mg total) by mouth daily.    Dispense:  45 tablet    Refill:  1    Patient Instructions  Medication Instructions:  START Metoprolol Succinate (Toprol-XL) 12.5 mg daily   *If you need a refill on your cardiac medications before your next appointment, please call your pharmacy*  Lab Work: NONE ordered at this time of appointment   If you have labs (blood work) drawn today and your tests are completely normal, you will receive your results only by: [place] (if you have MyChart) OR A paper copy in the mail If you have any lab test that is abnormal or we need to change your treatment, we will call you to review the results.  Testing/Procedures: Your physician has recommended that you have a sleep study. This test records  several body functions during sleep, including: brain activity, eye movement, oxygen and carbon dioxide blood levels, heart rate and rhythm, breathing rate and rhythm, the flow of air through your mouth and nose, snoring, body muscle movements, and chest and belly movement.    Follow-Up: At [facility], you and your health needs are our priority.  As part of our continuing mission to provide you with exceptional heart care, we have created designated Provider Care Teams.  These Care Teams include your primary Cardiologist (physician) and Advanced Practice Providers (APPs -  Physician Assistants and Nurse Practitioners) who all work together to provide you with the care you need, when you need it.   Your next appointment:   6 month(s)  The format for your next appointment:   In Person  Provider:   Berniece Salines, DO or APP   Other Instructions    Adopting a Healthy Lifestyle.  Know what a healthy weight is for you (roughly BMI <25) and aim to maintain this   Aim for 7+ servings of fruits and vegetables daily   65-80+ fluid ounces of water or unsweet tea for healthy kidneys   Limit to max 1 drink of alcohol per day; avoid smoking/tobacco   Limit animal fats in diet for cholesterol and heart health - choose grass fed whenever available   Avoid highly processed foods, and foods high in saturated/trans fats   Aim for low stress - take time to unwind and care for your mental health   Aim for 150 min of moderate intensity exercise weekly for heart health, and weights twice weekly for bone health   Aim for 7-9 hours of sleep daily   When it comes to diets, agreement about the perfect plan isnt easy to find, even among the experts. Experts at the [employer] developed an idea known as the Healthy Eating Plate. Just imagine a plate divided into logical, healthy portions.   The emphasis is on diet quality:   Load up on vegetables and fruits - one-half of your  plate: Aim for color  and variety, and remember that potatoes dont count.   Go for whole grains - one-quarter of your plate: Whole wheat, barley, wheat berries, quinoa, oats, brown rice, and foods made with them. If you want pasta, go with whole wheat pasta.   Protein power - one-quarter of your plate: Fish, chicken, beans, and nuts are all healthy, versatile protein sources. Limit red meat.   The diet, however, does go beyond the plate, offering a few other suggestions.   Use healthy plant oils, such as olive, canola, soy, corn, sunflower and peanut. Check the labels, and avoid partially hydrogenated oil, which have unhealthy trans fats.   If youre thirsty, drink water. Coffee and tea are good in moderation, but skip sugary drinks and limit milk and dairy products to one or two daily servings.   The type of carbohydrate in the diet is more important than the amount. Some sources of carbohydrates, such as vegetables, fruits, whole grains, and beans-are healthier than others.   Finally, stay active  Signed, Berniece Salines, DO  12/02/2020 11:09 AM    [place]

## 2020-12-05 ENCOUNTER — Other Ambulatory Visit: Payer: Self-pay | Admitting: Cardiology

## 2020-12-05 ENCOUNTER — Telehealth: Payer: Self-pay | Admitting: *Deleted

## 2020-12-05 DIAGNOSIS — I10 Essential (primary) hypertension: Secondary | ICD-10-CM

## 2020-12-05 DIAGNOSIS — R400 Somnolence: Secondary | ICD-10-CM

## 2020-12-05 NOTE — Telephone Encounter (Signed)
Left message on cell voice mail. HST has been ordered. Check mychart or call me back for details.

## 2020-12-24 ENCOUNTER — Encounter: Payer: Self-pay | Admitting: Orthopaedic Surgery

## 2020-12-24 ENCOUNTER — Ambulatory Visit (INDEPENDENT_AMBULATORY_CARE_PROVIDER_SITE_OTHER): Payer: Self-pay | Admitting: Orthopaedic Surgery

## 2020-12-24 ENCOUNTER — Other Ambulatory Visit: Payer: Self-pay

## 2020-12-24 DIAGNOSIS — S83242A Other tear of medial meniscus, current injury, left knee, initial encounter: Secondary | ICD-10-CM

## 2020-12-24 NOTE — Progress Notes (Signed)
   Post-Op Visit Note   Patient: Autumn [name]           Date of Birth: Jun 04, 1965           MRN: [id number] Visit Date: 12/24/2020 PCP: Reita May, NP   Assessment & Plan:  Chief Complaint:  Chief Complaint  Patient presents with   Left Knee - Pain   Visit Diagnoses:  1. Acute medial meniscus tear of left knee, initial encounter     Plan: Autumn [name] is 5 weeks status post left knee arthroscopy partial medial meniscectomy.  She is doing well overall and has pain from arthritis at times.  No real complaints today.  Range of motion has pretty much fully recovered.  Surgical scars are fully healed.  No joint effusion.  She is walking well.  At this point Autumn [name] has fully recovered from the surgery has done well.  She will continue to work on weight loss.  She can see [place] back as needed.  Follow-Up Instructions: Return if symptoms worsen or fail to improve.   Orders:  No orders of the defined types were placed in this encounter.  No orders of the defined types were placed in this encounter.   Imaging: No results found.  PMFS History: Patient Active Problem List   Diagnosis Date Noted   Daytime somnolence 12/02/2020   Prediabetes 12/02/2020   Hypertension 12/02/2020   Mixed hyperlipidemia 12/02/2020   Acute medial meniscus tear of left knee 11/06/2020   Lower abdominal pain 08/25/2020   Hx of adenomatous colonic polyps 06/23/2012   Chest pain on exertion 04/16/2012   Morbid obesity with BMI of 40.0-44.9, adult ([place]) 04/16/2012   Abdominal pain, acute, epigastric 04/16/2012   Past Medical History:  Diagnosis Date   Abdominal pain, acute, epigastric 04/16/2012   Arthritis    Chest pain on exertion 04/16/2012   Diverticulitis    last episode the sat before thanksgiving   Diverticulosis    Gallstones    History of kidney stones    Hx of adenomatous colonic polyps 06/23/2012   March 2012 - ascending colon polyp 1.2 cm = tubular adenoma (repeat March 2015)    Hypertension    Lower abdominal pain 08/25/2020   Morbid obesity with BMI of 40.0-44.9, adult ([place]) 04/16/2012   Osteoarthritis    Pre-diabetes     Family History  Problem Relation Age of Onset   Hypertension Mother    Diabetes Mother    Lymphoma Mother    Thyroid disease Mother    Lung cancer Father    Heart murmur Brother    Pancreatic cancer Paternal Aunt    Cancer Paternal Aunt        unknown type   Stomach cancer Paternal Uncle    Colon cancer Neg Hx     Past Surgical History:  Procedure Laterality Date   bladder tack  02/15/2005   CHOLECYSTECTOMY     KNEE ARTHROSCOPY WITH MENISCAL REPAIR Left 11/19/2020   Procedure: LEFT KNEE ARTHROSCOPY PARTIAL MEDIAL MENISCECTOMY;  Surgeon: Leandrew Koyanagi, MD;  Location: [place];  Service: Orthopedics;  Laterality: Left;   LAPAROSCOPIC TOTAL HYSTERECTOMY  2006   Social History   Occupational History   Occupation: ASSOCIATE    Employer: [employer]  Tobacco Use   Smoking status: Never   Smokeless tobacco: Never  Substance and Sexual Activity   Alcohol use: No   Drug use: No   Sexual activity: Not on file

## 2020-12-31 ENCOUNTER — Other Ambulatory Visit: Payer: Self-pay

## 2020-12-31 ENCOUNTER — Ambulatory Visit (HOSPITAL_BASED_OUTPATIENT_CLINIC_OR_DEPARTMENT_OTHER): Payer: MEDICAID | Admitting: Cardiovascular Disease

## 2021-01-21 ENCOUNTER — Ambulatory Visit (HOSPITAL_BASED_OUTPATIENT_CLINIC_OR_DEPARTMENT_OTHER): Payer: No Typology Code available for payment source | Attending: Cardiology | Admitting: Cardiovascular Disease

## 2021-01-21 DIAGNOSIS — Z6841 Body Mass Index (BMI) 40.0 and over, adult: Secondary | ICD-10-CM

## 2021-01-21 DIAGNOSIS — R400 Somnolence: Secondary | ICD-10-CM

## 2021-01-21 DIAGNOSIS — G4733 Obstructive sleep apnea (adult) (pediatric): Secondary | ICD-10-CM

## 2021-01-21 DIAGNOSIS — I10 Essential (primary) hypertension: Secondary | ICD-10-CM

## 2021-02-02 ENCOUNTER — Encounter (HOSPITAL_BASED_OUTPATIENT_CLINIC_OR_DEPARTMENT_OTHER): Payer: Self-pay | Admitting: Cardiovascular Disease

## 2021-02-02 ENCOUNTER — Telehealth: Payer: Self-pay | Admitting: *Deleted

## 2021-02-02 NOTE — Telephone Encounter (Signed)
-----   Message from Troy Sine, MD sent at 02/02/2021 10:18 AM EST ----- Autumn [name], please notify pt and CPAP titration study or Auto-PAP

## 2021-02-02 NOTE — Procedures (Signed)
° ° ° ° ° °  Patient Name: Autumn [name], Autumn [name] Date: 01/21/2021 Gender: Female D.O.B: Jun 19, 1965 Age (years): 55 Referring Provider: Godfrey Pick Tobb DO Height (inches): 67 Interpreting Physician: Shelva Majestic MD, ABSM Weight (lbs): 250 RPSGT: Jacolyn Reedy BMI: 39 MRN: [id number] Neck Size: <br>  CLINICAL INFORMATION Sleep Study Type: HST  Indication for sleep study: snoring, daytime fatigue and somnolence  Epworth Sleepiness Score: 5  SLEEP STUDY TECHNIQUE A multi-channel overnight portable sleep study was performed. The channels recorded were: nasal airflow, thoracic respiratory movement, and oxygen saturation with a pulse oximetry. Snoring was also monitored.  MEDICATIONS Patient self administered medications include: N/A.  SLEEP ARCHITECTURE Patient was studied for 372.5 minutes. The sleep efficiency was 100.0 % and the patient was supine for 58.5%. The arousal index was 0.0 per hour.  RESPIRATORY PARAMETERS The overall AHI was 20.5 per hour, with a central apnea index of 0 per hour. There is a positional component with supine sleep AHI 27.3/h versus non-supine sleep AHI 10.9/h. The severity during REM sleep cannot be assess on this study.  The oxygen nadir was 85% during sleep.  CARDIAC DATA Mean heart rate during sleep was 50.7 bpm.  IMPRESSIONS - Moderate obstructive sleep apnea occurred during this study (AHI  20.5/h); events were more prominent with supine sleep (AHI 27.3/h). - Moderate oxygen desaturation was noted during this study (Min O2 = 85%). - Patient snored 11.7% (43.7 minutes) during the sleep.  DIAGNOSIS - Obstructive Sleep Apnea (G47.33)  RECOMMENDATIONS - Recommend CPAP titraton to determine the optimal pressure to alleviate the patient's sleep disordered breathing.  If unable to obtain an in-lab titration initiate Auto-PAP with EPR of 3 at 7 - 18 cm of water. - Effort should be made to optimize nasal and oropharyngeal patency.  - Positional  therapy avoiding supine position during sleep. - Avoid alcohol, sedatives and other CNS depressants that may worsen sleep apnea and disrupt normal sleep architecture. - Sleep hygiene should be reviewed to assess factors that may improve sleep quality. - Weight management (BMI 39) and regular exercise should be initiated or continued. - Recommend a download and sleep clinic evaluation after one month of therapy.    [Electronically signed] 02/02/2021 10:12 AM  Shelva Majestic MD, [facility], ABSM Diplomate, American Board of Sleep Medicine   NPI: [id number]  [place] PH: [phone]   FX: [phone] [place]

## 2021-02-02 NOTE — Telephone Encounter (Signed)
Patient returned a call to me and was given sleep study results and recommendations. She states that she currently does not have any insurance. She will have to pass on the titration study due to cost. She was given the number to [facility] in [place] to check on cash CPAP pricing. She was also given the CPAP .com web information. The indigent program is currently on hold due to the back order situation. Once patient has decided if and where she will get a machine she will contact me back.

## 2021-02-02 NOTE — Telephone Encounter (Signed)
-----   Message from Troy Sine, MD sent at 02/02/2021 10:18 AM EST ----- Mariann Laster, please notify pt and CPAP titration study or Auto-PAP

## 2021-02-02 NOTE — Telephone Encounter (Signed)
Left message to return a call to discuss sleep study results and recommendations. 

## 2021-06-01 ENCOUNTER — Ambulatory Visit: Payer: No Typology Code available for payment source | Admitting: Cardiology

## 2021-06-02 ENCOUNTER — Ambulatory Visit (INDEPENDENT_AMBULATORY_CARE_PROVIDER_SITE_OTHER): Payer: No Typology Code available for payment source | Admitting: Cardiology

## 2021-06-02 ENCOUNTER — Encounter: Payer: Self-pay | Admitting: Cardiology

## 2021-06-02 VITALS — BP 132/90 | HR 49 | Ht 66.0 in | Wt 253.8 lb

## 2021-06-02 DIAGNOSIS — Z6841 Body Mass Index (BMI) 40.0 and over, adult: Secondary | ICD-10-CM

## 2021-06-02 DIAGNOSIS — I10 Essential (primary) hypertension: Secondary | ICD-10-CM

## 2021-06-02 DIAGNOSIS — Z79899 Other long term (current) drug therapy: Secondary | ICD-10-CM

## 2021-06-02 DIAGNOSIS — R001 Bradycardia, unspecified: Secondary | ICD-10-CM

## 2021-06-02 DIAGNOSIS — E782 Mixed hyperlipidemia: Secondary | ICD-10-CM

## 2021-06-02 DIAGNOSIS — G4733 Obstructive sleep apnea (adult) (pediatric): Secondary | ICD-10-CM

## 2021-06-02 MED ORDER — CARVEDILOL 3.125 MG PO TABS: 3.1250 mg | ORAL_TABLET | Freq: Two times a day (BID) | ORAL | 3 refills | Status: DC

## 2021-06-02 MED ORDER — ROSUVASTATIN CALCIUM 5 MG PO TABS: 5.0000 mg | ORAL_TABLET | Freq: Every day | ORAL | 3 refills | Status: DC

## 2021-06-02 NOTE — Progress Notes (Signed)
?Cardiology Office Note:   ? ?Date:  06/03/2021  ? ?ID:  Autumn [name], DOB [birth year], MRN [id number] ? ?PCP:  Reita May, NP  ?Cardiologist:  Berniece Salines, DO  ?Electrophysiologist:  None  ? ?Referring MD: Reita May, NP  ? ?" I am short of breath" ? ?History of Present Illness:   ? ?Autumn [name] is a 56 y.o. female with a hx of  prediabetes, hypertension, obesity is here today for follow-up visit.  ? ?I saw the patient on December 02, 2020 at that time she presented to be evaluated post knee surgery.  During that visit started patient on low-dose beta-blocker.  She was experiencing daytime somnolence as set her up for a sleep study.  She had been able to get her sleep study which diagnosed sleep apnea but unfortunately the patient is unable to afford her CPAP.  She has been denied for Medicaid but she has reapplied and tells me that she is can be working with a Chief Executive Officer as well but hopefully get approved for her insurance. ? ? ?Past Medical History:  ?Diagnosis Date  ? Abdominal pain, acute, epigastric 04/16/2012  ? Arthritis   ? Chest pain on exertion 04/16/2012  ? Diverticulitis   ? last episode the sat before thanksgiving  ? Diverticulosis   ? Gallstones   ? History of kidney stones   ? Hx of adenomatous colonic polyps 06/23/2012  ? March 2012 - ascending colon polyp 1.2 cm = tubular adenoma (repeat March 2015)  ? Hypertension   ? Lower abdominal pain 08/25/2020  ? Morbid obesity with BMI of 40.0-44.9, adult ([place]) 04/16/2012  ? Osteoarthritis   ? Pre-diabetes   ? ? ?Past Surgical History:  ?Procedure Laterality Date  ? bladder tack  02/15/2005  ? CHOLECYSTECTOMY    ? KNEE ARTHROSCOPY WITH MENISCAL REPAIR Left 11/19/2020  ? Procedure: LEFT KNEE ARTHROSCOPY PARTIAL MEDIAL MENISCECTOMY;  Surgeon: Leandrew Koyanagi, MD;  Location: [facility];  Service: Orthopedics;  Laterality: Left;  ? LAPAROSCOPIC TOTAL HYSTERECTOMY  2006  ? ? ?Current Medications: ?Current Meds  ?Medication Sig  ? acetaminophen  (TYLENOL) 325 MG tablet Take 650 mg by mouth every 6 (six) hours as needed for mild pain.  ? carvedilol (COREG) 3.125 MG tablet Take 1 tablet (3.125 mg total) by mouth 2 (two) times daily.  ? cyclobenzaprine (FLEXERIL) 5 MG tablet Take 5 mg by mouth 3 (three) times daily as needed for muscle spasms.  ? diclofenac Sodium (VOLTAREN) 1 % GEL Apply 2 g topically 4 (four) times daily as needed for pain.  ? hydrochlorothiazide (HYDRODIURIL) 25 MG tablet Take 25 mg by mouth daily.  ? rosuvastatin (CRESTOR) 5 MG tablet Take 1 tablet (5 mg total) by mouth daily.  ? [DISCONTINUED] metoprolol succinate (TOPROL XL) 25 MG 24 hr tablet Take 0.5 tablets (12.5 mg total) by mouth daily.  ?  ? ?Allergies:   Patient has no known allergies.  ? ?Social History  ? ?Socioeconomic History  ? Marital status: Married  ?  Spouse name: Not on file  ? Number of children: 1  ? Years of education: Not on file  ? Highest education level: Not on file  ?Occupational History  ? Occupation: ASSOCIATE  ?  Employer: [employer]  ?Tobacco Use  ? Smoking status: Never  ? Smokeless tobacco: Never  ?Substance and Sexual Activity  ? Alcohol use: No  ? Drug use: No  ? Sexual activity: Not on file  ?Other Topics Concern  ?  Not on file  ?Social History Narrative  ? Not on file  ? ?Social Determinants of Health  ? ?Financial Resource Strain: Not on file  ?Food Insecurity: Not on file  ?Transportation Needs: Not on file  ?Physical Activity: Not on file  ?Stress: Not on file  ?Social Connections: Not on file  ?  ? ?Family History: ?The patient's family history includes Cancer in her paternal aunt; Diabetes in her mother; Heart murmur in her brother; Hypertension in her mother; Lung cancer in her father; Lymphoma in her mother; Pancreatic cancer in her paternal aunt; Stomach cancer in her paternal uncle; Thyroid disease in her mother. There is no history of Colon cancer. ? ?ROS:   ?Review of Systems  ?Constitution: Negative for decreased appetite, fever and weight  gain.  ?HENT: Negative for congestion, ear discharge, hoarse voice and sore throat.   ?Eyes: Negative for discharge, redness, vision loss in right eye and visual halos.  ?Cardiovascular: Negative for chest pain, dyspnea on exertion, leg swelling, orthopnea and palpitations.  ?Respiratory: Negative for cough, hemoptysis, shortness of breath and snoring.   ?Endocrine: Negative for heat intolerance and polyphagia.  ?Hematologic/Lymphatic: Negative for bleeding problem. Does not bruise/bleed easily.  ?Skin: Negative for flushing, nail changes, rash and suspicious lesions.  ?Musculoskeletal: Negative for arthritis, joint pain, muscle cramps, myalgias, neck pain and stiffness.  ?Gastrointestinal: Negative for abdominal pain, bowel incontinence, diarrhea and excessive appetite.  ?Genitourinary: Negative for decreased libido, genital sores and incomplete emptying.  ?Neurological: Negative for brief paralysis, focal weakness, headaches and loss of balance.  ?Psychiatric/Behavioral: Negative for altered mental status, depression and suicidal ideas.  ?Allergic/Immunologic: Negative for HIV exposure and persistent infections.  ? ? ?EKGs/Labs/Other Studies Reviewed:   ? ?The following studies were reviewed today: ? ? ?EKG:  The ekg ordered today demonstrates  ? ?Echo August 2022 IMPRESSIONS  ? 1. Left ventricular ejection fraction, by estimation, is 50 to 55%. The  ?left ventricle has low normal function. The left ventricle has no regional  ?wall motion abnormalities. There is mild concentric left ventricular  ?hypertrophy. Left ventricular  ?diastolic parameters are consistent with Grade I diastolic dysfunction  ?(impaired relaxation).  ? 2. Right ventricular systolic function is normal. The right ventricular  ?size is normal. There is normal pulmonary artery systolic pressure.  ? 3. The mitral valve is normal in structure. No evidence of mitral valve  ?regurgitation. No evidence of mitral stenosis.  ? 4. The aortic valve is  tricuspid. Aortic valve regurgitation is not  ?visualized. No aortic stenosis is present.  ? 5. The inferior vena cava is normal in size with greater than 50%  ?respiratory variability, suggesting right atrial pressure of 3 mmHg.  ? ?FINDINGS  ? Left Ventricle: Left ventricular ejection fraction, by estimation, is 50  ?to 55%. The left ventricle has low normal function. The left ventricle has  ?no regional wall motion abnormalities. Global longitudinal strain  ?performed but not reported based on  ?interpreter judgement due to suboptimal tracking. The left ventricular  ?internal cavity size was normal in size. There is mild concentric left  ?ventricular hypertrophy. Left ventricular diastolic parameters are  ?consistent with Grade I diastolic dysfunction  ? (impaired relaxation). Normal left ventricular filling pressure.  ? ?Right Ventricle: The right ventricular size is normal. No increase in  ?right ventricular wall thickness. Right ventricular systolic function is  ?normal. There is normal pulmonary artery systolic pressure. The tricuspid  ?regurgitant velocity is 2.00 m/s, and  ? with an assumed  right atrial pressure of 3 mmHg, the estimated right  ?ventricular systolic pressure is 56.3 mmHg.  ? ?Left Atrium: Left atrial size was normal in size.  ? ?Right Atrium: Right atrial size was normal in size.  ? ?Pericardium: There is no evidence of pericardial effusion.  ? ?Mitral Valve: The mitral valve is normal in structure. No evidence of  ?mitral valve regurgitation. No evidence of mitral valve stenosis.  ? ?Tricuspid Valve: The tricuspid valve is normal in structure. Tricuspid  ?valve regurgitation is trivial. No evidence of tricuspid stenosis.  ? ?Aortic Valve: The aortic valve is tricuspid. Aortic valve regurgitation is  ?not visualized. No aortic stenosis is present.  ? ?Pulmonic Valve: The pulmonic valve was normal in structure. Pulmonic valve  ?regurgitation is not visualized. No evidence of pulmonic  stenosis.  ? ?Aorta: The aortic root, ascending aorta, aortic arch and descending aorta  ?are all structurally normal, with no evidence of dilitation or  ?obstruction.  ? ?Venous: The pulmonary veins were not well vis

## 2021-06-02 NOTE — Patient Instructions (Addendum)
Medication Instructions:  ?Your physician has recommended you make the following change in your medication:  ?STOP: Toprol-XL (Metoprolol succinate)  ?START: Coreg 3.125 mg twice daily ?START: Crestor 5 mg once daily  ?*If you need a refill on your cardiac medications before your next appointment, please call your pharmacy* ? ? ?Lab Work: ?Your physician recommends that you return for lab work in:  ?TODAY: BMET, Mag, CBC, Vitamin D, Vitamin B12 ?If you have labs (blood work) drawn today and your tests are completely normal, you will receive your results only by: ?MyChart Message (if you have MyChart) OR ?A paper copy in the mail ?If you have any lab test that is abnormal or we need to change your treatment, we will call you to review the results. ? ? ?Testing/Procedures: ?None ? ? ?Follow-Up: ?At [facility], you and your health needs are our priority.  As part of our continuing mission to provide you with exceptional heart care, we have created designated Provider Care Teams.  These Care Teams include your primary Cardiologist (physician) and Advanced Practice Providers (APPs -  Physician Assistants and Nurse Practitioners) who all work together to provide you with the care you need, when you need it. ? ?We recommend signing up for the patient portal called "MyChart".  Sign up information is provided on this After Visit Summary.  MyChart is used to connect with patients for Virtual Visits (Telemedicine).  Patients are able to view lab/test results, encounter notes, upcoming appointments, etc.  Non-urgent messages can be sent to your provider as well.   ?To learn more about what you can do with MyChart, go to [email or web address].   ? ?Your next appointment:   ?6 month(s) ? ?The format for your next appointment:   ?In Person ? ?Provider:   ?Berniece Salines, DO   ? ? ?Other Instructions ? ? ?Important Information About Sugar ? ? ? ? ?  ?

## 2021-06-04 LAB — CBC WITH DIFFERENTIAL/PLATELET
Basophils Absolute: 0.1 10*3/uL (ref 0.0–0.2)
Basos: 1 %
EOS (ABSOLUTE): 0.4 10*3/uL (ref 0.0–0.4)
Eos: 6 %
Hematocrit: 47.1 % — ABNORMAL HIGH (ref 34.0–46.6)
Hemoglobin: 15.8 g/dL (ref 11.1–15.9)
Immature Grans (Abs): 0 10*3/uL (ref 0.0–0.1)
Immature Granulocytes: 0 %
Lymphocytes Absolute: 2.8 10*3/uL (ref 0.7–3.1)
Lymphs: 40 %
MCH: 28.6 pg (ref 26.6–33.0)
MCHC: 33.5 g/dL (ref 31.5–35.7)
MCV: 85 fL (ref 79–97)
Monocytes Absolute: 0.5 10*3/uL (ref 0.1–0.9)
Monocytes: 8 %
Neutrophils Absolute: 3.2 10*3/uL (ref 1.4–7.0)
Neutrophils: 45 %
Platelets: 259 10*3/uL (ref 150–450)
RBC: 5.53 x10E6/uL — ABNORMAL HIGH (ref 3.77–5.28)
RDW: 13.3 % (ref 11.7–15.4)
WBC: 7 10*3/uL (ref 3.4–10.8)

## 2021-06-04 LAB — BASIC METABOLIC PANEL
BUN/Creatinine Ratio: 15 (ref 9–23)
BUN: 11 mg/dL (ref 6–24)
CO2: 21 mmol/L (ref 20–29)
Calcium: 9.3 mg/dL (ref 8.7–10.2)
Chloride: 103 mmol/L (ref 96–106)
Creatinine, Ser: 0.74 mg/dL (ref 0.57–1.00)
Glucose: 98 mg/dL (ref 70–99)
Potassium: 4.5 mmol/L (ref 3.5–5.2)
Sodium: 141 mmol/L (ref 134–144)
eGFR: 94 mL/min/{1.73_m2} (ref >59)

## 2021-06-04 LAB — MAGNESIUM: Magnesium: 2.1 mg/dL (ref 1.6–2.3)

## 2021-06-04 LAB — VITAMIN B12: Vitamin B-12: 307 pg/mL (ref 232–1245)

## 2021-06-04 LAB — VITAMIN D 25 HYDROXY (VIT D DEFICIENCY, FRACTURES): Vit D, 25-Hydroxy: 22.1 ng/mL — ABNORMAL LOW (ref 30.0–100.0)

## 2021-06-08 ENCOUNTER — Other Ambulatory Visit: Payer: Self-pay

## 2021-06-08 ENCOUNTER — Telehealth: Payer: Self-pay | Admitting: Cardiology

## 2021-06-08 MED ORDER — VITAMIN D (ERGOCALCIFEROL) 1.25 MG (50000 UNIT) PO CAPS: 50000.0000 [IU] | ORAL_CAPSULE | ORAL | 0 refills | Status: DC

## 2021-06-08 NOTE — Progress Notes (Signed)
Prescription sent to pharmacy.

## 2021-06-08 NOTE — Telephone Encounter (Signed)
Spoke with pt regarding recent labs results.  ? ? Berniece Salines, DO  ?06/03/2021  4:49 PM EDT   ?  ?Still with vitamin D deficiency.  We will replete your vitamin D 50,000 units once weekly for 12 weeks.  ? ?Recommendations given to pt. She verbalizes understanding.  ?

## 2021-06-08 NOTE — Telephone Encounter (Signed)
Patient was returning call for results. Please advise °

## 2021-09-14 DIAGNOSIS — I10 Essential (primary) hypertension: Secondary | ICD-10-CM | POA: Insufficient documentation

## 2022-01-06 ENCOUNTER — Ambulatory Visit: Payer: Self-pay | Admitting: Cardiology

## 2022-01-18 ENCOUNTER — Encounter: Payer: Self-pay | Admitting: Cardiology

## 2022-01-18 ENCOUNTER — Ambulatory Visit: Payer: Medicaid Other | Attending: Cardiology | Admitting: Cardiology

## 2022-01-18 VITALS — BP 130/82 | HR 54 | Ht 66.0 in | Wt 249.2 lb

## 2022-01-18 DIAGNOSIS — E782 Mixed hyperlipidemia: Secondary | ICD-10-CM

## 2022-01-18 DIAGNOSIS — I10 Essential (primary) hypertension: Secondary | ICD-10-CM

## 2022-01-18 DIAGNOSIS — R7303 Prediabetes: Secondary | ICD-10-CM

## 2022-01-18 DIAGNOSIS — R079 Chest pain, unspecified: Secondary | ICD-10-CM

## 2022-01-18 DIAGNOSIS — Z6841 Body Mass Index (BMI) 40.0 and over, adult: Secondary | ICD-10-CM

## 2022-01-18 MED ORDER — ASPIRIN 81 MG PO TBEC: 81.0000 mg | DELAYED_RELEASE_TABLET | Freq: Every day | ORAL | 3 refills | Status: DC

## 2022-01-18 MED ORDER — NITROGLYCERIN 0.4 MG SL SUBL: 0.4000 mg | SUBLINGUAL_TABLET | SUBLINGUAL | 3 refills | Status: DC | PRN

## 2022-01-18 NOTE — Progress Notes (Signed)
  Heart and Vascular Care Navigation  01/18/2022  Autumn [name] [birth year] [id number]  Reason for Referral: new Medicaid expansion enrollee Patient is participating in a Managed Medicaid [place] [employer] as of 01/15/22  Engaged with patient face to face for initial visit for Heart and Vascular Care Coordination.                                                                                                   Assessment:                                     LCSW met with pt during appt at [facility]. Pt accompanied by her husband. Introduced self, role, reason for visit. Pt aware of her enrollment in [place] Medicaid expansion- has [facility] Medicaid starting 12/1. She has spoken with [facility] and transferred to her preferred PCP and is aware of how to access further benefits. Pt spouse already enrolled in Medicaid (~2 years). No additional questions/concerns. Encouraged pt/pt family to contact the office should additional concerns arise.   HRT/VAS Care Coordination     Patients Home Cardiology Office [place] Team Social Worker   Social Worker Name: Westley Hummer, , [place]   Living arrangements for the past 2 months Mobile Home   Lives with: Spouse   Patient Current Insurance Coverage Medicaid   Patient Has Concern With Paying Medical Bills No   Does Patient Have Prescription Coverage? Yes   Home Assistive Devices/Equipment Eyeglasses       Social History:                                                                             SDOH Screenings   Tobacco Use: Low Risk  (01/18/2022)   Westley Hummer, MSW, [place]  [phone]- work cell phone (preferred) [phone]- desk phone

## 2022-01-18 NOTE — H&P (View-Only) (Signed)
Cardiology Office Note:    Date:  01/18/2022   ID:  Autumn [name], DOB 16-Sep-1965, MRN [id number]  PCP:  Reita May, NP  Cardiologist:  Berniece Salines, DO  Electrophysiologist:  None   Referring MD: Reita May, NP   " I am short of breath"  History of Present Illness:    Autumn [name] is a 56 y.o. female with a hx of  prediabetes, hypertension, obesity is here today for follow-up visit.   I saw the patient on December 02, 2020 at that time she presented to be evaluated post knee surgery.  During that visit started patient on low-dose beta-blocker.  She was experiencing daytime somnolence as set her up for a sleep study.  She had been able to get her sleep study which diagnosed sleep apnea but unfortunately the patient is unable to afford her CPAP.  She has been denied for Medicaid but she has reapplied and tells me that she is can be working with a Chief Executive Officer as well but hopefully get approved for her insurance.  At her visit on June 02, 2021 she was short of breath I suspect that this might be multifactorial with on treated sleep apnea as well as deconditioning.  She was bradycardic on the Toprol-XL and was also hypotensive I transition the patient to Coreg.  Since I saw the patient she tells me that she has had progressive shortness of breath as well as chest pain.  She describes experiencing midsternal chest discomfort.  Comes and goes.  Thing makes it better or worse.  She is concerned about this.  It last for a few minutes and then resolves.   Past Medical History:  Diagnosis Date   Abdominal pain, acute, epigastric 04/16/2012   Arthritis    Chest pain on exertion 04/16/2012   Diverticulitis    last episode the sat before thanksgiving   Diverticulosis    Gallstones    History of kidney stones    Hx of adenomatous colonic polyps 06/23/2012   March 2012 - ascending colon polyp 1.2 cm = tubular adenoma (repeat March 2015)   Hypertension    Lower abdominal pain 08/25/2020    Morbid obesity with BMI of 40.0-44.9, adult ([place]) 04/16/2012   Osteoarthritis    Pre-diabetes     Past Surgical History:  Procedure Laterality Date   bladder tack  02/15/2005   CHOLECYSTECTOMY     KNEE ARTHROSCOPY WITH MENISCAL REPAIR Left 11/19/2020   Procedure: LEFT KNEE ARTHROSCOPY PARTIAL MEDIAL MENISCECTOMY;  Surgeon: Leandrew Koyanagi, MD;  Location: [facility];  Service: Orthopedics;  Laterality: Left;   LAPAROSCOPIC TOTAL HYSTERECTOMY  2006    Current Medications: Current Meds  Medication Sig   acetaminophen (TYLENOL) 325 MG tablet Take 650 mg by mouth every 6 (six) hours as needed for mild pain.   aspirin EC 81 MG tablet Take 1 tablet (81 mg total) by mouth daily. Swallow whole.   carvedilol (COREG) 3.125 MG tablet Take 1 tablet (3.125 mg total) by mouth 2 (two) times daily.   cyclobenzaprine (FLEXERIL) 5 MG tablet Take 5 mg by mouth 3 (three) times daily as needed for muscle spasms.   diclofenac (VOLTAREN) 75 MG EC tablet Take 75 mg by mouth 2 (two) times daily as needed for moderate pain.   diclofenac Sodium (VOLTAREN) 1 % GEL Apply 2 g topically 4 (four) times daily as needed for pain.   hydrochlorothiazide (HYDRODIURIL) 25 MG tablet Take 25 mg by mouth daily.  nitroGLYCERIN (NITROSTAT) 0.4 MG SL tablet Place 1 tablet (0.4 mg total) under the tongue every 5 (five) minutes as needed for chest pain.   ondansetron (ZOFRAN) 4 MG tablet Take 1-2 tablets (4-8 mg total) by mouth every 8 (eight) hours as needed for nausea or vomiting.   rosuvastatin (CRESTOR) 5 MG tablet Take 1 tablet (5 mg total) by mouth daily.     Allergies:   Patient has no known allergies.   Social History   Socioeconomic History   Marital status: Married    Spouse name: Not on file   Number of children: 1   Years of education: Not on file   Highest education level: Not on file  Occupational History   Occupation: ASSOCIATE    Employer: [id number]  Tobacco Use   Smoking status: Never    Smokeless tobacco: Never  Substance and Sexual Activity   Alcohol use: No   Drug use: No   Sexual activity: Not on file  Other Topics Concern   Not on file  Social History Narrative   Not on file   Social Determinants of Health   Financial Resource Strain: Not on file  Food Insecurity: Not on file  Transportation Needs: Not on file  Physical Activity: Not on file  Stress: Not on file  Social Connections: Not on file     Family History: The patient's family history includes Cancer in her paternal aunt; Diabetes in her mother; Heart murmur in her brother; Hypertension in her mother; Lung cancer in her father; Lymphoma in her mother; Pancreatic cancer in her paternal aunt; Stomach cancer in her paternal uncle; Thyroid disease in her mother. There is no history of Colon cancer.  ROS:   Review of Systems  Constitution: Negative for decreased appetite, fever and weight gain.  HENT: Negative for congestion, ear discharge, hoarse voice and sore throat.   Eyes: Negative for discharge, redness, vision loss in right eye and visual halos.  Cardiovascular: Negative for chest pain, dyspnea on exertion, leg swelling, orthopnea and palpitations.  Respiratory: Negative for cough, hemoptysis, shortness of breath and snoring.   Endocrine: Negative for heat intolerance and polyphagia.  Hematologic/Lymphatic: Negative for bleeding problem. Does not bruise/bleed easily.  Skin: Negative for flushing, nail changes, rash and suspicious lesions.  Musculoskeletal: Negative for arthritis, joint pain, muscle cramps, myalgias, neck pain and stiffness.  Gastrointestinal: Negative for abdominal pain, bowel incontinence, diarrhea and excessive appetite.  Genitourinary: Negative for decreased libido, genital sores and incomplete emptying.  Neurological: Negative for brief paralysis, focal weakness, headaches and loss of balance.  Psychiatric/Behavioral: Negative for altered mental status, depression and suicidal  ideas.  Allergic/Immunologic: Negative for HIV exposure and persistent infections.    EKGs/Labs/Other Studies Reviewed:    The following studies were reviewed today:   EKG:  The ekg ordered today demonstrates sinus bradycardia, heart rate 54 bpm  Echo August 2022 IMPRESSIONS   1. Left ventricular ejection fraction, by estimation, is 50 to 55%. The left ventricle has low normal function. The left ventricle has no regional wall motion abnormalities. There is mild concentric left ventricular hypertrophy. Left ventricular  diastolic parameters are consistent with Grade I diastolic dysfunction (impaired relaxation).   2. Right ventricular systolic function is normal. The right ventricular size is normal. There is normal pulmonary artery systolic pressure.   3. The mitral valve is normal in structure. No evidence of mitral valve regurgitation. No evidence of mitral stenosis.   4. The aortic valve is tricuspid. Aortic valve regurgitation  is not visualized. No aortic stenosis is present.   5. The inferior vena cava is normal in size with greater than 50%  respiratory variability, suggesting right atrial pressure of 3 mmHg.   FINDINGS   Left Ventricle: Left ventricular ejection fraction, by estimation, is 50  to 55%. The left ventricle has low normal function. The left ventricle has  no regional wall motion abnormalities. Global longitudinal strain  performed but not reported based on  interpreter judgement due to suboptimal tracking. The left ventricular  internal cavity size was normal in size. There is mild concentric left  ventricular hypertrophy. Left ventricular diastolic parameters are  consistent with Grade I diastolic dysfunction   (impaired relaxation). Normal left ventricular filling pressure.   Right Ventricle: The right ventricular size is normal. No increase in  right ventricular wall thickness. Right ventricular systolic function is  normal. There is normal pulmonary artery  systolic pressure. The tricuspid  regurgitant velocity is 2.00 m/s, and   with an assumed right atrial pressure of 3 mmHg, the estimated right  ventricular systolic pressure is 33.2 mmHg.   Left Atrium: Left atrial size was normal in size.   Right Atrium: Right atrial size was normal in size.   Pericardium: There is no evidence of pericardial effusion.   Mitral Valve: The mitral valve is normal in structure. No evidence of  mitral valve regurgitation. No evidence of mitral valve stenosis.   Tricuspid Valve: The tricuspid valve is normal in structure. Tricuspid  valve regurgitation is trivial. No evidence of tricuspid stenosis.   Aortic Valve: The aortic valve is tricuspid. Aortic valve regurgitation is  not visualized. No aortic stenosis is present.   Pulmonic Valve: The pulmonic valve was normal in structure. Pulmonic valve  regurgitation is not visualized. No evidence of pulmonic stenosis.   Aorta: The aortic root, ascending aorta, aortic arch and descending aorta  are all structurally normal, with no evidence of dilitation or  obstruction.   Venous: The pulmonary veins were not well visualized. The inferior vena  cava is normal in size with greater than 50% respiratory variability,  suggesting right atrial pressure of 3 mmHg.   IAS/Shunts: No atrial level shunt detected by color flow Doppler.    Nuclear stress test 2022 The left ventricular ejection fraction is normal (55-65%). Nuclear stress EF: 61%. There was no ST segment deviation noted during stress. The study is normal. This is a low risk study.  Recent Labs: 06/02/2021: BUN 11; Creatinine, Ser 0.74; Hemoglobin 15.8; Magnesium 2.1; Platelets 259; Potassium 4.5; Sodium 141  Recent Lipid Panel    Component Value Date/Time   CHOL 183 04/16/2012 1050   TRIG 117 04/16/2012 1050   HDL 48 04/16/2012 1050   CHOLHDL 3.8 04/16/2012 1050   VLDL 23 04/16/2012 1050   LDLCALC 112 (H) 04/16/2012 1050    Physical Exam:     VS:  BP 130/82   Pulse (!) 54   Ht '5\' 6"'$  (1.676 m)   Wt 249 lb 3.2 oz (113 kg)   SpO2 98%   BMI 40.22 kg/m     Wt Readings from Last 3 Encounters:  01/18/22 249 lb 3.2 oz (113 kg)  06/02/21 253 lb 12.8 oz (115.1 kg)  01/21/21 250 lb (113.4 kg)     GEN: Well nourished, well developed in no acute distress HEENT: Normal NECK: No JVD; No carotid bruits LYMPHATICS: No lymphadenopathy CARDIAC: S1S2 noted,RRR, no murmurs, rubs, gallops RESPIRATORY:  Clear to auscultation without rales, wheezing or  rhonchi  ABDOMEN: Soft, non-tender, non-distended, +bowel sounds, no guarding. EXTREMITIES: No edema, No cyanosis, no clubbing MUSCULOSKELETAL:  No deformity  SKIN: Warm and dry NEUROLOGIC:  Alert and oriented x 3, non-focal PSYCHIATRIC:  Normal affect, good insight  ASSESSMENT:    1. Primary hypertension   2. Chest pain on exertion   3. Morbid obesity with BMI of 40.0-44.9, adult ([place])   4. Prediabetes   5. Mixed hyperlipidemia     PLAN:     She has had chest discomfort that is worsening.  She had a recent nuclear stress test which was normal.  The patient is intermediate to high risk.  I think for her cardiac catheterization will be the best option here.  I considered a coronary CTA but her pain is more of a typical angina and will be best to just move ahead with the heart catheterization.  The patient understands that risks include but are not limited to stroke (1 in 1000), death (1 in 48), kidney failure [usually temporary] (1 in 500), bleeding (1 in 200), allergic reaction [possibly serious] (1 in 200), and agrees to proceed.  Sublingual nitroglycerin prescription was sent, its protocol and 911 protocol explained and the patient vocalized understanding questions were answered to the patient's satisfaction  She has had some good response with her carvedilol for her blood pressure.  The patient understands the need to lose weight with diet and exercise. We have discussed  specific strategies for this.  The patient is in agreement with the above plan. The patient left the office in stable condition.  The patient will follow up in 1 month or sooner if needed.   Medication Adjustments/Labs and Tests Ordered: Current medicines are reviewed at length with the patient today.  Concerns regarding medicines are outlined above.  Orders Placed This Encounter  Procedures   Basic metabolic panel   CBC   Lipid panel   Hemoglobin A1c   EKG 12-Lead   Meds ordered this encounter  Medications   nitroGLYCERIN (NITROSTAT) 0.4 MG SL tablet    Sig: Place 1 tablet (0.4 mg total) under the tongue every 5 (five) minutes as needed for chest pain.    Dispense:  90 tablet    Refill:  3   aspirin EC 81 MG tablet    Sig: Take 1 tablet (81 mg total) by mouth daily. Swallow whole.    Dispense:  90 tablet    Refill:  3    Patient Instructions  Medication Instructions:  Your physician has recommended you make the following change in your medication:  START Aspirin '81mg'$  by mouth ONCE daily START Nitro by mouth AS NEED for Chests Pain   *If you need a refill on your cardiac medications before your next appointment, please call your pharmacy*   Lab Work: Your physician recommends that you return for lab work in: [employer]  If you have labs (blood work) drawn today and your tests are completely normal, you will receive your results only by: [place] (if you have MyChart) OR A paper copy in the mail If you have any lab test that is abnormal or we need to change your treatment, we will call you to review the results.   Testing/Procedures: See Instructions below...   Follow-Up: At [facility], you and your health needs are our priority.  As part of our continuing mission to provide you with exceptional heart care, we have created designated Provider Care Teams.  These Care Teams include your primary  Cardiologist (physician) and Advanced Practice Providers (APPs  -  Physician Assistants and Nurse Practitioners) who all work together to provide you with the care you need, when you need it.  We recommend signing up for the patient portal called "MyChart".  Sign up information is provided on this After Visit Summary.  MyChart is used to connect with patients for Virtual Visits (Telemedicine).  Patients are able to view lab/test results, encounter notes, upcoming appointments, etc.  Non-urgent messages can be sent to your provider as well.   To learn more about what you can do with MyChart, go to [email or web address].    Your next appointment:   4 week(s)  The format for your next appointment:   In Person  Provider:   Berniece Salines, DO     Other Instructions  [place] A DEPT OF [place] A DEPT OF [place]. [facility] [place] [id number] [place] [address] Dept: [place]: [place]  01/18/2022  You are scheduled for a Cardiac Catheterization on Monday, December 11 with Dr. Sherren Mocha.  1. Please arrive at the [facility] (Main Entrance A) at [facility]: [address] [place], [place] [address] at 5:30 AM (This time is two hours before your procedure to ensure your preparation). Free valet parking service is available.   Special note: Every effort is made to have your procedure done on time. Please understand that emergencies sometimes delay scheduled procedures.  2. Diet: Do not eat solid foods after midnight.  The patient may have clear liquids until 5am upon the day of the procedure.  3. Labs: You will need to have blood drawn on TODAY! You do not need to be fasting.  4. Medication instructions in preparation for your procedure:   Contrast Allergy: No   On the morning of your procedure, take your Aspirin 81 mg and any morning medicines NOT listed above.  You may use sips of water.  5. Plan for one  night stay--bring personal belongings. 6. Bring a current list of your medications and current insurance cards. 7. You MUST have a responsible person to drive you home. 8. Someone MUST be with you the first 24 hours after you arrive home or your discharge will be delayed. 9. Please wear clothes that are easy to get on and off and wear slip-on shoes.  Thank you for allowing [place] to care for you!   -- [facility]   Important Information About Sugar         Adopting a Healthy Lifestyle.  Know what a healthy weight is for you (roughly BMI <25) and aim to maintain this   Aim for 7+ servings of fruits and vegetables daily   65-80+ fluid ounces of water or unsweet tea for healthy kidneys   Limit to max 1 drink of alcohol per day; avoid smoking/tobacco   Limit animal fats in diet for cholesterol and heart health - choose grass fed whenever available   Avoid highly processed foods, and foods high in saturated/trans fats   Aim for low stress - take time to unwind and care for your mental health   Aim for 150 min of moderate intensity exercise weekly for heart health, and weights twice weekly for bone health   Aim for 7-9 hours of sleep daily   When it comes to diets, agreement about the perfect plan isnt easy to find,  even among the experts. Experts at the [employer] developed an idea known as the Healthy Eating Plate. Just imagine a plate divided into logical, healthy portions.   The emphasis is on diet quality:   Load up on vegetables and fruits - one-half of your plate: Aim for color and variety, and remember that potatoes dont count.   Go for whole grains - one-quarter of your plate: Whole wheat, barley, wheat berries, quinoa, oats, brown rice, and foods made with them. If you want pasta, go with whole wheat pasta.   Protein power - one-quarter of your plate: Fish, chicken, beans, and nuts are all healthy, versatile  protein sources. Limit red meat.   The diet, however, does go beyond the plate, offering a few other suggestions.   Use healthy plant oils, such as olive, canola, soy, corn, sunflower and peanut. Check the labels, and avoid partially hydrogenated oil, which have unhealthy trans fats.   If youre thirsty, drink water. Coffee and tea are good in moderation, but skip sugary drinks and limit milk and dairy products to one or two daily servings.   The type of carbohydrate in the diet is more important than the amount. Some sources of carbohydrates, such as vegetables, fruits, whole grains, and beans-are healthier than others.   Finally, stay active  Signed, Berniece Salines, DO  01/18/2022 9:57 AM    [place]

## 2022-01-18 NOTE — Progress Notes (Signed)
Cardiology Office Note:    Date:  01/18/2022   ID:  Autumn [name], DOB 1966/01/22, MRN [id number]  PCP:  Reita May, NP  Cardiologist:  Berniece Salines, DO  Electrophysiologist:  None   Referring MD: Reita May, NP   " I am short of breath"  History of Present Illness:    Autumn [name] is a 56 y.o. female with a hx of  prediabetes, hypertension, obesity is here today for follow-up visit.   I saw the patient on December 02, 2020 at that time she presented to be evaluated post knee surgery.  During that visit started patient on low-dose beta-blocker.  She was experiencing daytime somnolence as set her up for a sleep study.  She had been able to get her sleep study which diagnosed sleep apnea but unfortunately the patient is unable to afford her CPAP.  She has been denied for Medicaid but she has reapplied and tells me that she is can be working with a Chief Executive Officer as well but hopefully get approved for her insurance.  At her visit on June 02, 2021 she was short of breath I suspect that this might be multifactorial with on treated sleep apnea as well as deconditioning.  She was bradycardic on the Toprol-XL and was also hypotensive I transition the patient to Coreg.  Since I saw the patient she tells me that she has had progressive shortness of breath as well as chest pain.  She describes experiencing midsternal chest discomfort.  Comes and goes.  Thing makes it better or worse.  She is concerned about this.  It last for a few minutes and then resolves.   Past Medical History:  Diagnosis Date   Abdominal pain, acute, epigastric 04/16/2012   Arthritis    Chest pain on exertion 04/16/2012   Diverticulitis    last episode the sat before thanksgiving   Diverticulosis    Gallstones    History of kidney stones    Hx of adenomatous colonic polyps 06/23/2012   March 2012 - ascending colon polyp 1.2 cm = tubular adenoma (repeat March 2015)   Hypertension    Lower abdominal pain 08/25/2020    Morbid obesity with BMI of 40.0-44.9, adult ([place]) 04/16/2012   Osteoarthritis    Pre-diabetes     Past Surgical History:  Procedure Laterality Date   bladder tack  02/15/2005   CHOLECYSTECTOMY     KNEE ARTHROSCOPY WITH MENISCAL REPAIR Left 11/19/2020   Procedure: LEFT KNEE ARTHROSCOPY PARTIAL MEDIAL MENISCECTOMY;  Surgeon: Leandrew Koyanagi, MD;  Location: [facility];  Service: Orthopedics;  Laterality: Left;   LAPAROSCOPIC TOTAL HYSTERECTOMY  2006    Current Medications: Current Meds  Medication Sig   acetaminophen (TYLENOL) 325 MG tablet Take 650 mg by mouth every 6 (six) hours as needed for mild pain.   aspirin EC 81 MG tablet Take 1 tablet (81 mg total) by mouth daily. Swallow whole.   carvedilol (COREG) 3.125 MG tablet Take 1 tablet (3.125 mg total) by mouth 2 (two) times daily.   cyclobenzaprine (FLEXERIL) 5 MG tablet Take 5 mg by mouth 3 (three) times daily as needed for muscle spasms.   diclofenac (VOLTAREN) 75 MG EC tablet Take 75 mg by mouth 2 (two) times daily as needed for moderate pain.   diclofenac Sodium (VOLTAREN) 1 % GEL Apply 2 g topically 4 (four) times daily as needed for pain.   hydrochlorothiazide (HYDRODIURIL) 25 MG tablet Take 25 mg by mouth daily.  nitroGLYCERIN (NITROSTAT) 0.4 MG SL tablet Place 1 tablet (0.4 mg total) under the tongue every 5 (five) minutes as needed for chest pain.   ondansetron (ZOFRAN) 4 MG tablet Take 1-2 tablets (4-8 mg total) by mouth every 8 (eight) hours as needed for nausea or vomiting.   rosuvastatin (CRESTOR) 5 MG tablet Take 1 tablet (5 mg total) by mouth daily.     Allergies:   Patient has no known allergies.   Social History   Socioeconomic History   Marital status: Married    Spouse name: Not on file   Number of children: 1   Years of education: Not on file   Highest education level: Not on file  Occupational History   Occupation: ASSOCIATE    Employer: [id number]  Tobacco Use   Smoking status: Never    Smokeless tobacco: Never  Substance and Sexual Activity   Alcohol use: No   Drug use: No   Sexual activity: Not on file  Other Topics Concern   Not on file  Social History Narrative   Not on file   Social Determinants of Health   Financial Resource Strain: Not on file  Food Insecurity: Not on file  Transportation Needs: Not on file  Physical Activity: Not on file  Stress: Not on file  Social Connections: Not on file     Family History: The patient's family history includes Cancer in her paternal aunt; Diabetes in her mother; Heart murmur in her brother; Hypertension in her mother; Lung cancer in her father; Lymphoma in her mother; Pancreatic cancer in her paternal aunt; Stomach cancer in her paternal uncle; Thyroid disease in her mother. There is no history of Colon cancer.  ROS:   Review of Systems  Constitution: Negative for decreased appetite, fever and weight gain.  HENT: Negative for congestion, ear discharge, hoarse voice and sore throat.   Eyes: Negative for discharge, redness, vision loss in right eye and visual halos.  Cardiovascular: Negative for chest pain, dyspnea on exertion, leg swelling, orthopnea and palpitations.  Respiratory: Negative for cough, hemoptysis, shortness of breath and snoring.   Endocrine: Negative for heat intolerance and polyphagia.  Hematologic/Lymphatic: Negative for bleeding problem. Does not bruise/bleed easily.  Skin: Negative for flushing, nail changes, rash and suspicious lesions.  Musculoskeletal: Negative for arthritis, joint pain, muscle cramps, myalgias, neck pain and stiffness.  Gastrointestinal: Negative for abdominal pain, bowel incontinence, diarrhea and excessive appetite.  Genitourinary: Negative for decreased libido, genital sores and incomplete emptying.  Neurological: Negative for brief paralysis, focal weakness, headaches and loss of balance.  Psychiatric/Behavioral: Negative for altered mental status, depression and suicidal  ideas.  Allergic/Immunologic: Negative for HIV exposure and persistent infections.    EKGs/Labs/Other Studies Reviewed:    The following studies were reviewed today:   EKG:  The ekg ordered today demonstrates sinus bradycardia, heart rate 54 bpm  Echo August 2022 IMPRESSIONS   1. Left ventricular ejection fraction, by estimation, is 50 to 55%. The left ventricle has low normal function. The left ventricle has no regional wall motion abnormalities. There is mild concentric left ventricular hypertrophy. Left ventricular  diastolic parameters are consistent with Grade I diastolic dysfunction (impaired relaxation).   2. Right ventricular systolic function is normal. The right ventricular size is normal. There is normal pulmonary artery systolic pressure.   3. The mitral valve is normal in structure. No evidence of mitral valve regurgitation. No evidence of mitral stenosis.   4. The aortic valve is tricuspid. Aortic valve regurgitation  is not visualized. No aortic stenosis is present.   5. The inferior vena cava is normal in size with greater than 50%  respiratory variability, suggesting right atrial pressure of 3 mmHg.   FINDINGS   Left Ventricle: Left ventricular ejection fraction, by estimation, is 50  to 55%. The left ventricle has low normal function. The left ventricle has  no regional wall motion abnormalities. Global longitudinal strain  performed but not reported based on  interpreter judgement due to suboptimal tracking. The left ventricular  internal cavity size was normal in size. There is mild concentric left  ventricular hypertrophy. Left ventricular diastolic parameters are  consistent with Grade I diastolic dysfunction   (impaired relaxation). Normal left ventricular filling pressure.   Right Ventricle: The right ventricular size is normal. No increase in  right ventricular wall thickness. Right ventricular systolic function is  normal. There is normal pulmonary artery  systolic pressure. The tricuspid  regurgitant velocity is 2.00 m/s, and   with an assumed right atrial pressure of 3 mmHg, the estimated right  ventricular systolic pressure is 99.3 mmHg.   Left Atrium: Left atrial size was normal in size.   Right Atrium: Right atrial size was normal in size.   Pericardium: There is no evidence of pericardial effusion.   Mitral Valve: The mitral valve is normal in structure. No evidence of  mitral valve regurgitation. No evidence of mitral valve stenosis.   Tricuspid Valve: The tricuspid valve is normal in structure. Tricuspid  valve regurgitation is trivial. No evidence of tricuspid stenosis.   Aortic Valve: The aortic valve is tricuspid. Aortic valve regurgitation is  not visualized. No aortic stenosis is present.   Pulmonic Valve: The pulmonic valve was normal in structure. Pulmonic valve  regurgitation is not visualized. No evidence of pulmonic stenosis.   Aorta: The aortic root, ascending aorta, aortic arch and descending aorta  are all structurally normal, with no evidence of dilitation or  obstruction.   Venous: The pulmonary veins were not well visualized. The inferior vena  cava is normal in size with greater than 50% respiratory variability,  suggesting right atrial pressure of 3 mmHg.   IAS/Shunts: No atrial level shunt detected by color flow Doppler.    Nuclear stress test 2022 The left ventricular ejection fraction is normal (55-65%). Nuclear stress EF: 61%. There was no ST segment deviation noted during stress. The study is normal. This is a low risk study.  Recent Labs: 06/02/2021: BUN 11; Creatinine, Ser 0.74; Hemoglobin 15.8; Magnesium 2.1; Platelets 259; Potassium 4.5; Sodium 141  Recent Lipid Panel    Component Value Date/Time   CHOL 183 04/16/2012 1050   TRIG 117 04/16/2012 1050   HDL 48 04/16/2012 1050   CHOLHDL 3.8 04/16/2012 1050   VLDL 23 04/16/2012 1050   LDLCALC 112 (H) 04/16/2012 1050    Physical Exam:     VS:  BP 130/82   Pulse (!) 54   Ht '5\' 6"'$  (1.676 m)   Wt 249 lb 3.2 oz (113 kg)   SpO2 98%   BMI 40.22 kg/m     Wt Readings from Last 3 Encounters:  01/18/22 249 lb 3.2 oz (113 kg)  06/02/21 253 lb 12.8 oz (115.1 kg)  01/21/21 250 lb (113.4 kg)     GEN: Well nourished, well developed in no acute distress HEENT: Normal NECK: No JVD; No carotid bruits LYMPHATICS: No lymphadenopathy CARDIAC: S1S2 noted,RRR, no murmurs, rubs, gallops RESPIRATORY:  Clear to auscultation without rales, wheezing or  rhonchi  ABDOMEN: Soft, non-tender, non-distended, +bowel sounds, no guarding. EXTREMITIES: No edema, No cyanosis, no clubbing MUSCULOSKELETAL:  No deformity  SKIN: Warm and dry NEUROLOGIC:  Alert and oriented x 3, non-focal PSYCHIATRIC:  Normal affect, good insight  ASSESSMENT:    1. Primary hypertension   2. Chest pain on exertion   3. Morbid obesity with BMI of 40.0-44.9, adult ([place])   4. Prediabetes   5. Mixed hyperlipidemia     PLAN:     She has had chest discomfort that is worsening.  She had a recent nuclear stress test which was normal.  The patient is intermediate to high risk.  I think for her cardiac catheterization will be the best option here.  I considered a coronary CTA but her pain is more of a typical angina and will be best to just move ahead with the heart catheterization.  The patient understands that risks include but are not limited to stroke (1 in 1000), death (1 in 50), kidney failure [usually temporary] (1 in 500), bleeding (1 in 200), allergic reaction [possibly serious] (1 in 200), and agrees to proceed.  Sublingual nitroglycerin prescription was sent, its protocol and 911 protocol explained and the patient vocalized understanding questions were answered to the patient's satisfaction  She has had some good response with her carvedilol for her blood pressure.  The patient understands the need to lose weight with diet and exercise. We have discussed  specific strategies for this.  The patient is in agreement with the above plan. The patient left the office in stable condition.  The patient will follow up in 1 month or sooner if needed.   Medication Adjustments/Labs and Tests Ordered: Current medicines are reviewed at length with the patient today.  Concerns regarding medicines are outlined above.  Orders Placed This Encounter  Procedures   Basic metabolic panel   CBC   Lipid panel   Hemoglobin A1c   EKG 12-Lead   Meds ordered this encounter  Medications   nitroGLYCERIN (NITROSTAT) 0.4 MG SL tablet    Sig: Place 1 tablet (0.4 mg total) under the tongue every 5 (five) minutes as needed for chest pain.    Dispense:  90 tablet    Refill:  3   aspirin EC 81 MG tablet    Sig: Take 1 tablet (81 mg total) by mouth daily. Swallow whole.    Dispense:  90 tablet    Refill:  3    Patient Instructions  Medication Instructions:  Your physician has recommended you make the following change in your medication:  START Aspirin '81mg'$  by mouth ONCE daily START Nitro by mouth AS NEED for Chests Pain   *If you need a refill on your cardiac medications before your next appointment, please call your pharmacy*   Lab Work: Your physician recommends that you return for lab work in: [place]  If you have labs (blood work) drawn today and your tests are completely normal, you will receive your results only by: [facility] (if you have MyChart) OR A paper copy in the mail If you have any lab test that is abnormal or we need to change your treatment, we will call you to review the results.   Testing/Procedures: See Instructions below...   Follow-Up: At [facility], you and your health needs are our priority.  As part of our continuing mission to provide you with exceptional heart care, we have created designated Provider Care Teams.  These Care Teams include your primary  Cardiologist (physician) and Advanced Practice Providers (APPs  -  Physician Assistants and Nurse Practitioners) who all work together to provide you with the care you need, when you need it.  We recommend signing up for the patient portal called "MyChart".  Sign up information is provided on this After Visit Summary.  MyChart is used to connect with patients for Virtual Visits (Telemedicine).  Patients are able to view lab/test results, encounter notes, upcoming appointments, etc.  Non-urgent messages can be sent to your provider as well.   To learn more about what you can do with MyChart, go to [email or web address].    Your next appointment:   4 week(s)  The format for your next appointment:   In Person  Provider:   Berniece Salines, DO     Other Instructions  [place] A DEPT OF [place] A DEPT OF [place]. [facility] [place] [id number] [place] [address] Dept: [place]: [place]  01/18/2022  You are scheduled for a Cardiac Catheterization on Monday, December 11 with Dr. Sherren Mocha.  1. Please arrive at the [facility] (Main Entrance A) at [facility]: [address] [place], [place] [address] at 5:30 AM (This time is two hours before your procedure to ensure your preparation). Free valet parking service is available.   Special note: Every effort is made to have your procedure done on time. Please understand that emergencies sometimes delay scheduled procedures.  2. Diet: Do not eat solid foods after midnight.  The patient may have clear liquids until 5am upon the day of the procedure.  3. Labs: You will need to have blood drawn on TODAY! You do not need to be fasting.  4. Medication instructions in preparation for your procedure:   Contrast Allergy: No   On the morning of your procedure, take your Aspirin 81 mg and any morning medicines NOT listed above.  You may use sips of water.  5. Plan for one  night stay--bring personal belongings. 6. Bring a current list of your medications and current insurance cards. 7. You MUST have a responsible person to drive you home. 8. Someone MUST be with you the first 24 hours after you arrive home or your discharge will be delayed. 9. Please wear clothes that are easy to get on and off and wear slip-on shoes.  Thank you for allowing [place] to care for you!   -- [place] [facility] [facility]   Important Information About Sugar         Adopting a Healthy Lifestyle.  Know what a healthy weight is for you (roughly BMI <25) and aim to maintain this   Aim for 7+ servings of fruits and vegetables daily   65-80+ fluid ounces of water or unsweet tea for healthy kidneys   Limit to max 1 drink of alcohol per day; avoid smoking/tobacco   Limit animal fats in diet for cholesterol and heart health - choose grass fed whenever available   Avoid highly processed foods, and foods high in saturated/trans fats   Aim for low stress - take time to unwind and care for your mental health   Aim for 150 min of moderate intensity exercise weekly for heart health, and weights twice weekly for bone health   Aim for 7-9 hours of sleep daily   When it comes to diets, agreement about the perfect plan isnt easy to find,  even among the experts. Experts at the [place] developed an idea known as the Healthy Eating Plate. Just imagine a plate divided into logical, healthy portions.   The emphasis is on diet quality:   Load up on vegetables and fruits - one-half of your plate: Aim for color and variety, and remember that potatoes dont count.   Go for whole grains - one-quarter of your plate: Whole wheat, barley, wheat berries, quinoa, oats, brown rice, and foods made with them. If you want pasta, go with whole wheat pasta.   Protein power - one-quarter of your plate: Fish, chicken, beans, and nuts are all healthy, versatile  protein sources. Limit red meat.   The diet, however, does go beyond the plate, offering a few other suggestions.   Use healthy plant oils, such as olive, canola, soy, corn, sunflower and peanut. Check the labels, and avoid partially hydrogenated oil, which have unhealthy trans fats.   If youre thirsty, drink water. Coffee and tea are good in moderation, but skip sugary drinks and limit milk and dairy products to one or two daily servings.   The type of carbohydrate in the diet is more important than the amount. Some sources of carbohydrates, such as vegetables, fruits, whole grains, and beans-are healthier than others.   Finally, stay active  Signed, Berniece Salines, DO  01/18/2022 9:57 AM    [place]

## 2022-01-18 NOTE — Patient Instructions (Signed)
Medication Instructions:  Your physician has recommended you make the following change in your medication:  START Aspirin '81mg'$  by mouth ONCE daily START Nitro by mouth AS NEED for Chests Pain   *If you need a refill on your cardiac medications before your next appointment, please call your pharmacy*   Lab Work: Your physician recommends that you return for lab work in: [place]  If you have labs (blood work) drawn today and your tests are completely normal, you will receive your results only by: [place] (if you have MyChart) OR A paper copy in the mail If you have any lab test that is abnormal or we need to change your treatment, we will call you to review the results.   Testing/Procedures: See Instructions below...   Follow-Up: At [facility], you and your health needs are our priority.  As part of our continuing mission to provide you with exceptional heart care, we have created designated Provider Care Teams.  These Care Teams include your primary Cardiologist (physician) and Advanced Practice Providers (APPs -  Physician Assistants and Nurse Practitioners) who all work together to provide you with the care you need, when you need it.  We recommend signing up for the patient portal called "MyChart".  Sign up information is provided on this After Visit Summary.  MyChart is used to connect with patients for Virtual Visits (Telemedicine).  Patients are able to view lab/test results, encounter notes, upcoming appointments, etc.  Non-urgent messages can be sent to your provider as well.   To learn more about what you can do with MyChart, go to [email or web address].    Your next appointment:   4 week(s)  The format for your next appointment:   In Person  Provider:   Berniece Salines, DO     Other Instructions  [place] A DEPT OF [place] A DEPT OF [place]. [facility] [place] [id number] [place] [address] Dept: [place]: [place]  01/18/2022  You are scheduled for a Cardiac Catheterization on Monday, December 11 with Dr. Sherren Mocha.  1. Please arrive at the [facility] (Main Entrance A) at [facility]: [address] [place], [place] [address] at 5:30 AM (This time is two hours before your procedure to ensure your preparation). Free valet parking service is available.   Special note: Every effort is made to have your procedure done on time. Please understand that emergencies sometimes delay scheduled procedures.  2. Diet: Do not eat solid foods after midnight.  The patient may have clear liquids until 5am upon the day of the procedure.  3. Labs: You will need to have blood drawn on TODAY! You do not need to be fasting.  4. Medication instructions in preparation for your procedure:   Contrast Allergy: No   On the morning of your procedure, take your Aspirin 81 mg and any morning medicines NOT listed above.  You may use sips of water.  5. Plan for one night stay--bring personal belongings. 6. Bring a current list of your medications and current insurance cards. 7. You MUST have a responsible person to drive you home. 8. Someone MUST be with you the first 24 hours after you arrive home or your discharge will be delayed. 9. Please wear clothes that are easy to get on and off and wear slip-on shoes.  Thank you for allowing [place] to care for you!   --  [place] [facility]   Important Information About Sugar

## 2022-01-19 LAB — BASIC METABOLIC PANEL
BUN/Creatinine Ratio: 14 (ref 9–23)
BUN: 12 mg/dL (ref 6–24)
CO2: 24 mmol/L (ref 20–29)
Calcium: 9.7 mg/dL (ref 8.7–10.2)
Chloride: 103 mmol/L (ref 96–106)
Creatinine, Ser: 0.83 mg/dL (ref 0.57–1.00)
Glucose: 107 mg/dL — ABNORMAL HIGH (ref 70–99)
Potassium: 4.3 mmol/L (ref 3.5–5.2)
Sodium: 140 mmol/L (ref 134–144)
eGFR: 82 mL/min/{1.73_m2} (ref >59)

## 2022-01-19 LAB — HEMOGLOBIN A1C
Est. average glucose Bld gHb Est-mCnc: 128 mg/dL
Hgb A1c MFr Bld: 6.1 % — ABNORMAL HIGH (ref 4.8–5.6)

## 2022-01-19 LAB — CBC
Hematocrit: 49.8 % — ABNORMAL HIGH (ref 34.0–46.6)
Hemoglobin: 15.9 g/dL (ref 11.1–15.9)
MCH: 28.1 pg (ref 26.6–33.0)
MCHC: 31.9 g/dL (ref 31.5–35.7)
MCV: 88 fL (ref 79–97)
Platelets: 274 10*3/uL (ref 150–450)
RBC: 5.65 x10E6/uL — ABNORMAL HIGH (ref 3.77–5.28)
RDW: 12.9 % (ref 11.7–15.4)
WBC: 7.5 10*3/uL (ref 3.4–10.8)

## 2022-01-19 LAB — LIPID PANEL
Chol/HDL Ratio: 3.3 ratio (ref 0.0–4.4)
Cholesterol, Total: 152 mg/dL (ref 100–199)
HDL: 46 mg/dL (ref >39)
LDL Chol Calc (NIH): 83 mg/dL (ref 0–99)
Triglycerides: 128 mg/dL (ref 0–149)
VLDL Cholesterol Cal: 23 mg/dL (ref 5–40)

## 2022-01-21 ENCOUNTER — Telehealth: Payer: Self-pay | Admitting: *Deleted

## 2022-01-21 NOTE — Telephone Encounter (Addendum)
Cardiac Catheterization scheduled at [facility] for: Friday January 22, 2022 7:30 AM Arrival time and place: [place] Entrance A at: 5:30 AM  Nothing to eat after midnight prior to procedure, clear liquids until 5 AM day of procedure.  Medication instructions: -Hold:  HCTZ-AM of procedure -Except hold medications usual morning medications can be taken with sips of water including aspirin 81 mg.  Confirmed patient has responsible adult to drive home post procedure and be with patient first 24 hours after arriving home.  Patient reports no new symptoms concerning for COVID-19 in the past 10 days.  Reviewed procedure instructions with patient.

## 2022-01-25 ENCOUNTER — Other Ambulatory Visit: Payer: Self-pay

## 2022-01-25 ENCOUNTER — Encounter (HOSPITAL_COMMUNITY)
Admission: RE | Disposition: A | Discharge status: Home / Self Care | Payer: Self-pay | Source: Home / Self Care | Attending: Cardiovascular Disease

## 2022-01-25 ENCOUNTER — Encounter (HOSPITAL_COMMUNITY): Payer: Self-pay | Admitting: Cardiovascular Disease

## 2022-01-25 ENCOUNTER — Ambulatory Visit (HOSPITAL_COMMUNITY)
Admission: RE | Admit: 2022-01-25 | Discharge: 2022-01-25 | Disposition: A | Discharge status: Home / Self Care | Payer: Medicaid Other | Attending: Cardiovascular Disease | Admitting: Cardiovascular Disease

## 2022-01-25 DIAGNOSIS — I10 Essential (primary) hypertension: Secondary | ICD-10-CM | POA: Insufficient documentation

## 2022-01-25 DIAGNOSIS — G4730 Sleep apnea, unspecified: Secondary | ICD-10-CM | POA: Insufficient documentation

## 2022-01-25 DIAGNOSIS — R7303 Prediabetes: Secondary | ICD-10-CM | POA: Insufficient documentation

## 2022-01-25 DIAGNOSIS — Z6841 Body Mass Index (BMI) 40.0 and over, adult: Secondary | ICD-10-CM | POA: Insufficient documentation

## 2022-01-25 DIAGNOSIS — E782 Mixed hyperlipidemia: Secondary | ICD-10-CM | POA: Diagnosis not present

## 2022-01-25 DIAGNOSIS — R079 Chest pain, unspecified: Secondary | ICD-10-CM | POA: Diagnosis not present

## 2022-01-25 HISTORY — PX: LEFT HEART CATH AND CORONARY ANGIOGRAPHY: CATH118249

## 2022-01-25 SURGERY — LEFT HEART CATH AND CORONARY ANGIOGRAPHY
Anesthesia: LOCAL

## 2022-01-25 MED ORDER — HYDRALAZINE HCL 20 MG/ML IJ SOLN: 10.0000 mg | INTRAMUSCULAR | Status: DC | PRN

## 2022-01-25 MED ORDER — ACETAMINOPHEN 325 MG PO TABS: 650.0000 mg | ORAL_TABLET | ORAL | Status: DC | PRN

## 2022-01-25 MED ORDER — LABETALOL HCL 5 MG/ML IV SOLN: 10.0000 mg | INTRAVENOUS | Status: DC | PRN

## 2022-01-25 MED ORDER — LIDOCAINE HCL (PF) 1 % IJ SOLN
INTRAMUSCULAR | Status: DC | PRN
  Administered 2022-01-25: 2 mL

## 2022-01-25 MED ORDER — IOHEXOL 350 MG/ML SOLN
INTRAVENOUS | Status: DC | PRN
  Administered 2022-01-25: 45 mL

## 2022-01-25 MED ORDER — FENTANYL CITRATE (PF) 100 MCG/2ML IJ SOLN
INTRAMUSCULAR | Status: DC | PRN
  Administered 2022-01-25: 25 ug via INTRAVENOUS

## 2022-01-25 MED ORDER — FENTANYL CITRATE (PF) 100 MCG/2ML IJ SOLN
INTRAMUSCULAR | Status: AC
  Filled 2022-01-25: qty 2

## 2022-01-25 MED ORDER — MIDAZOLAM HCL 2 MG/2ML IJ SOLN
INTRAMUSCULAR | Status: DC | PRN
  Administered 2022-01-25: 2 mg via INTRAVENOUS

## 2022-01-25 MED ORDER — HEPARIN (PORCINE) IN NACL 1000-0.9 UT/500ML-% IV SOLN
INTRAVENOUS | Status: DC | PRN
  Administered 2022-01-25 (×2): 500 mL

## 2022-01-25 MED ORDER — SODIUM CHLORIDE 0.9% FLUSH: 3.0000 mL | Freq: Two times a day (BID) | INTRAVENOUS | Status: DC

## 2022-01-25 MED ORDER — SODIUM CHLORIDE 0.9 % IV SOLN: INTRAVENOUS | Status: DC

## 2022-01-25 MED ORDER — ONDANSETRON HCL 4 MG/2ML IJ SOLN: 4.0000 mg | Freq: Four times a day (QID) | INTRAMUSCULAR | Status: DC | PRN

## 2022-01-25 MED ORDER — SODIUM CHLORIDE 0.9% FLUSH: 3.0000 mL | INTRAVENOUS | Status: DC | PRN

## 2022-01-25 MED ORDER — VERAPAMIL HCL 2.5 MG/ML IV SOLN
INTRAVENOUS | Status: AC
  Filled 2022-01-25: qty 2

## 2022-01-25 MED ORDER — ASPIRIN 81 MG PO CHEW: 81.0000 mg | CHEWABLE_TABLET | Freq: Once | ORAL | Status: DC

## 2022-01-25 MED ORDER — HEPARIN SODIUM (PORCINE) 1000 UNIT/ML IJ SOLN
INTRAMUSCULAR | Status: DC | PRN
  Administered 2022-01-25: 5000 [IU] via INTRAVENOUS

## 2022-01-25 MED ORDER — HEPARIN SODIUM (PORCINE) 1000 UNIT/ML IJ SOLN
INTRAMUSCULAR | Status: AC
  Filled 2022-01-25: qty 10

## 2022-01-25 MED ORDER — SODIUM CHLORIDE 0.9 % WEIGHT BASED INFUSION: 1.0000 mL/kg/h | INTRAVENOUS | Status: DC

## 2022-01-25 MED ORDER — LIDOCAINE HCL (PF) 1 % IJ SOLN
INTRAMUSCULAR | Status: AC
  Filled 2022-01-25: qty 30

## 2022-01-25 MED ORDER — SODIUM CHLORIDE 0.9 % WEIGHT BASED INFUSION
3.0000 mL/kg/h | INTRAVENOUS | Status: AC
  Administered 2022-01-25: 3 mL/kg/h via INTRAVENOUS

## 2022-01-25 MED ORDER — SODIUM CHLORIDE 0.9 % IV SOLN: 250.0000 mL | INTRAVENOUS | Status: DC | PRN

## 2022-01-25 MED ORDER — VERAPAMIL HCL 2.5 MG/ML IV SOLN
INTRAVENOUS | Status: DC | PRN
  Administered 2022-01-25: 10 mL via INTRA_ARTERIAL

## 2022-01-25 MED ORDER — MIDAZOLAM HCL 2 MG/2ML IJ SOLN
INTRAMUSCULAR | Status: AC
  Filled 2022-01-25: qty 2

## 2022-01-25 SURGICAL SUPPLY — 10 items
CATH 5FR JL3.5 JR4 ANG PIG MP (CATHETERS) IMPLANT
DEVICE RAD COMP TR BAND LRG (VASCULAR PRODUCTS) IMPLANT
GLIDESHEATH SLEND SS 6F .021 (SHEATH) IMPLANT
GUIDEWIRE INQWIRE 1.5J.035X260 (WIRE) IMPLANT
INQWIRE 1.5J .035X260CM (WIRE) ×1
KIT HEART LEFT (KITS) ×1 IMPLANT
PACK CARDIAC CATHETERIZATION (CUSTOM PROCEDURE TRAY) ×1 IMPLANT
SYR MEDRAD MARK 7 150ML (SYRINGE) ×1 IMPLANT
TRANSDUCER W/STOPCOCK (MISCELLANEOUS) ×1 IMPLANT
TUBING CIL FLEX 10 FLL-RA (TUBING) ×1 IMPLANT

## 2022-01-25 NOTE — Interval H&P Note (Signed)
History and Physical Interval Note:  01/25/2022 7:53 AM  Autumn [name]  has presented today for surgery, with the diagnosis of chest pain.  The various methods of treatment have been discussed with the patient and family. After consideration of risks, benefits and other options for treatment, the patient has consented to  Procedure(s): LEFT HEART CATH AND CORONARY ANGIOGRAPHY (N/A) as a surgical intervention.  The patient's history has been reviewed, patient examined, no change in status, stable for surgery.  I have reviewed the patient's chart and labs.  Questions were answered to the patient's satisfaction.     Sherren Mocha

## 2022-01-25 NOTE — Progress Notes (Signed)
TR band removed, new dressing placed. new arm brace in place. No bleeding or hematoma noted. Will continue to monitor

## 2022-02-25 ENCOUNTER — Ambulatory Visit: Payer: Medicaid Other | Admitting: Cardiology

## 2022-04-12 ENCOUNTER — Encounter: Payer: Self-pay | Admitting: Cardiology

## 2022-04-12 ENCOUNTER — Ambulatory Visit: Payer: Medicaid Other | Attending: Cardiology | Admitting: Cardiology

## 2022-04-12 VITALS — BP 144/96 | HR 71 | Ht 67.0 in | Wt 251.6 lb

## 2022-04-12 DIAGNOSIS — I10 Essential (primary) hypertension: Secondary | ICD-10-CM

## 2022-04-12 DIAGNOSIS — R0789 Other chest pain: Secondary | ICD-10-CM | POA: Insufficient documentation

## 2022-04-12 DIAGNOSIS — Z6841 Body Mass Index (BMI) 40.0 and over, adult: Secondary | ICD-10-CM

## 2022-04-12 DIAGNOSIS — E782 Mixed hyperlipidemia: Secondary | ICD-10-CM | POA: Diagnosis not present

## 2022-04-12 MED ORDER — CARVEDILOL 6.25 MG PO TABS: 6.2500 mg | ORAL_TABLET | Freq: Two times a day (BID) | ORAL | 3 refills | Status: DC

## 2022-04-12 NOTE — Progress Notes (Signed)
Cardiology Office Note:    Date:  04/12/2022   ID:  Darcel Stokley, DOB 1965-04-08, MRN [id number]  PCP:  Reita May, NP  Cardiologist:  Berniece Salines, DO  Electrophysiologist:  None   Referring MD: Reita May, NP   " I am short of breath"  History of Present Illness:    Autumn [name] is a 57 y.o. female with a hx of  prediabetes, hypertension, obesity is here today for follow-up visit.   I saw the patient on December 02, 2020 at that time she presented to be evaluated post knee surgery.  During that visit started patient on low-dose beta-blocker.  She was experiencing daytime somnolence as set her up for a sleep study.  She had been able to get her sleep study which diagnosed sleep apnea but unfortunately the patient is unable to afford her CPAP.  She has been denied for Medicaid but she has reapplied and tells me that she is can be working with a Chief Executive Officer as well but hopefully get approved for her insurance.  At her visit on June 02, 2021 she was short of breath I suspect that this might be multifactorial with on treated sleep apnea as well as deconditioning.  She was bradycardic on the Toprol-XL and was also hypotensive I transition the patient to Coreg.  At her visit in December she was experiencing significant chest pain.  Given the severity of symptoms is in the patient for left heart catheterization.  She had her left heart catheterization on December 11 which did not show any evidence of coronary artery disease.  Since her procedure she still has been experiencing left-sided chest discomfort.  Asking the patient in full detail she tells me that these pains are on the left breast reproducible.  She recently had a mammogram was normal.  No other complaints at this time.   Past Medical History:  Diagnosis Date   Abdominal pain, acute, epigastric 04/16/2012   Arthritis    Chest pain on exertion 04/16/2012   Diverticulitis    last episode the sat before thanksgiving    Diverticulosis    Gallstones    History of kidney stones    Hx of adenomatous colonic polyps 06/23/2012   March 2012 - ascending colon polyp 1.2 cm = tubular adenoma (repeat March 2015)   Hypertension    Lower abdominal pain 08/25/2020   Morbid obesity with BMI of 40.0-44.9, adult ([place]) 04/16/2012   Osteoarthritis    Pre-diabetes     Past Surgical History:  Procedure Laterality Date   bladder tack  02/15/2005   CHOLECYSTECTOMY     KNEE ARTHROSCOPY WITH MENISCAL REPAIR Left 11/19/2020   Procedure: LEFT KNEE ARTHROSCOPY PARTIAL MEDIAL MENISCECTOMY;  Surgeon: Leandrew Koyanagi, MD;  Location: [facility];  Service: Orthopedics;  Laterality: Left;   LAPAROSCOPIC TOTAL HYSTERECTOMY  2006   LEFT HEART CATH AND CORONARY ANGIOGRAPHY N/A 01/25/2022   Procedure: LEFT HEART CATH AND CORONARY ANGIOGRAPHY;  Surgeon: Sherren Mocha, MD;  Location: [facility];  Service: Cardiovascular;  Laterality: N/A;    Current Medications: Current Meds  Medication Sig   acetaminophen (TYLENOL) 325 MG tablet Take 650 mg by mouth every 6 (six) hours as needed for mild pain.   aspirin EC 81 MG tablet Take 1 tablet (81 mg total) by mouth daily. Swallow whole.   carvedilol (COREG) 6.25 MG tablet Take 1 tablet (6.25 mg total) by mouth 2 (two) times daily.   diclofenac Sodium (VOLTAREN) 1 %  GEL Apply 2 g topically 4 (four) times daily as needed for pain.   hydrochlorothiazide (HYDRODIURIL) 25 MG tablet Take 25 mg by mouth daily.   Menthol, Topical Analgesic, (ICY HOT EX) Apply 1 Application topically daily as needed (pain).   nitroGLYCERIN (NITROSTAT) 0.4 MG SL tablet Place 1 tablet (0.4 mg total) under the tongue every 5 (five) minutes as needed for chest pain.   rosuvastatin (CRESTOR) 5 MG tablet Take 1 tablet (5 mg total) by mouth daily.   [DISCONTINUED] carvedilol (COREG) 3.125 MG tablet Take 1 tablet (3.125 mg total) by mouth 2 (two) times daily.     Allergies:   Patient has no known  allergies.   Social History   Socioeconomic History   Marital status: Married    Spouse name: Not on file   Number of children: 1   Years of education: Not on file   Highest education level: Not on file  Occupational History   Occupation: ASSOCIATE    Employer: [id number]  Tobacco Use   Smoking status: Never   Smokeless tobacco: Never  Substance and Sexual Activity   Alcohol use: No   Drug use: No   Sexual activity: Not on file  Other Topics Concern   Not on file  Social History Narrative   Not on file   Social Determinants of Health   Financial Resource Strain: Not on file  Food Insecurity: Not on file  Transportation Needs: Not on file  Physical Activity: Not on file  Stress: Not on file  Social Connections: Not on file     Family History: The patient's family history includes Cancer in her paternal aunt; Diabetes in her mother; Heart murmur in her brother; Hypertension in her mother; Lung cancer in her father; Lymphoma in her mother; Pancreatic cancer in her paternal aunt; Stomach cancer in her paternal uncle; Thyroid disease in her mother. There is no history of Colon cancer.  ROS:   Review of Systems  Constitution: Negative for decreased appetite, fever and weight gain.  HENT: Negative for congestion, ear discharge, hoarse voice and sore throat.   Eyes: Negative for discharge, redness, vision loss in right eye and visual halos.  Cardiovascular: Negative for chest pain, dyspnea on exertion, leg swelling, orthopnea and palpitations.  Respiratory: Negative for cough, hemoptysis, shortness of breath and snoring.   Endocrine: Negative for heat intolerance and polyphagia.  Hematologic/Lymphatic: Negative for bleeding problem. Does not bruise/bleed easily.  Skin: Negative for flushing, nail changes, rash and suspicious lesions.  Musculoskeletal: Negative for arthritis, joint pain, muscle cramps, myalgias, neck pain and stiffness.  Gastrointestinal: Negative for abdominal  pain, bowel incontinence, diarrhea and excessive appetite.  Genitourinary: Negative for decreased libido, genital sores and incomplete emptying.  Neurological: Negative for brief paralysis, focal weakness, headaches and loss of balance.  Psychiatric/Behavioral: Negative for altered mental status, depression and suicidal ideas.  Allergic/Immunologic: Negative for HIV exposure and persistent infections.    EKGs/Labs/Other Studies Reviewed:    The following studies were reviewed today:   EKG: None today  Left heart catheterization 01/25/2022 1.  Patent coronary arteries, right dominant anatomy, with no significant stenosis throughout the coronary tree.  Minimal luminal irregularities are noted. 2.  Normal LVEDP with known normal LV function by noninvasive assessment in the past Suspect noncardiac chest pain   Echo August 2022 IMPRESSIONS   1. Left ventricular ejection fraction, by estimation, is 50 to 55%. The left ventricle has low normal function. The left ventricle has no regional wall motion  abnormalities. There is mild concentric left ventricular hypertrophy. Left ventricular  diastolic parameters are consistent with Grade I diastolic dysfunction (impaired relaxation).   2. Right ventricular systolic function is normal. The right ventricular size is normal. There is normal pulmonary artery systolic pressure.   3. The mitral valve is normal in structure. No evidence of mitral valve regurgitation. No evidence of mitral stenosis.   4. The aortic valve is tricuspid. Aortic valve regurgitation is not visualized. No aortic stenosis is present.   5. The inferior vena cava is normal in size with greater than 50%  respiratory variability, suggesting right atrial pressure of 3 mmHg.   FINDINGS   Left Ventricle: Left ventricular ejection fraction, by estimation, is 50  to 55%. The left ventricle has low normal function. The left ventricle has  no regional wall motion abnormalities. Global  longitudinal strain  performed but not reported based on  interpreter judgement due to suboptimal tracking. The left ventricular  internal cavity size was normal in size. There is mild concentric left  ventricular hypertrophy. Left ventricular diastolic parameters are  consistent with Grade I diastolic dysfunction   (impaired relaxation). Normal left ventricular filling pressure.   Right Ventricle: The right ventricular size is normal. No increase in  right ventricular wall thickness. Right ventricular systolic function is  normal. There is normal pulmonary artery systolic pressure. The tricuspid  regurgitant velocity is 2.00 m/s, and   with an assumed right atrial pressure of 3 mmHg, the estimated right  ventricular systolic pressure is [id number] mmHg.   Left Atrium: Left atrial size was normal in size.   Right Atrium: Right atrial size was normal in size.   Pericardium: There is no evidence of pericardial effusion.   Mitral Valve: The mitral valve is normal in structure. No evidence of  mitral valve regurgitation. No evidence of mitral valve stenosis.   Tricuspid Valve: The tricuspid valve is normal in structure. Tricuspid  valve regurgitation is trivial. No evidence of tricuspid stenosis.   Aortic Valve: The aortic valve is tricuspid. Aortic valve regurgitation is  not visualized. No aortic stenosis is present.   Pulmonic Valve: The pulmonic valve was normal in structure. Pulmonic valve  regurgitation is not visualized. No evidence of pulmonic stenosis.   Aorta: The aortic root, ascending aorta, aortic arch and descending aorta  are all structurally normal, with no evidence of dilitation or  obstruction.   Venous: The pulmonary veins were not well visualized. The inferior vena  cava is normal in size with greater than 50% respiratory variability,  suggesting right atrial pressure of 3 mmHg.   IAS/Shunts: No atrial level shunt detected by color flow Doppler.    Nuclear stress  test 2022 The left ventricular ejection fraction is normal (55-65%). Nuclear stress EF: 61%. There was no ST segment deviation noted during stress. The study is normal. This is a low risk study.  Recent Labs: 06/02/2021: Magnesium 2.1 01/18/2022: BUN 12; Creatinine, Ser 0.83; Hemoglobin 15.9; Platelets 274; Potassium 4.3; Sodium 140  Recent Lipid Panel    Component Value Date/Time   CHOL 152 01/18/2022 0948   TRIG 128 01/18/2022 0948   HDL 46 01/18/2022 0948   CHOLHDL 3.3 01/18/2022 0948   CHOLHDL 3.8 04/16/2012 1050   VLDL 23 04/16/2012 1050   LDLCALC 83 01/18/2022 0948    Physical Exam:    VS:  BP (!) 144/96   Pulse 71   Ht '5\' 7"'$  (1.702 m)   Wt 114.1 kg   SpO2  96%   BMI 39.41 kg/m     Wt Readings from Last 3 Encounters:  04/12/22 114.1 kg  01/25/22 112.9 kg  01/18/22 113 kg     GEN: Well nourished, well developed in no acute distress HEENT: Normal NECK: No JVD; No carotid bruits LYMPHATICS: No lymphadenopathy CARDIAC: S1S2 noted,RRR, no murmurs, rubs, gallops RESPIRATORY:  Clear to auscultation without rales, wheezing or rhonchi  ABDOMEN: Soft, non-tender, non-distended, +bowel sounds, no guarding. EXTREMITIES: No edema, No cyanosis, no clubbing MUSCULOSKELETAL:  No deformity  SKIN: Warm and dry NEUROLOGIC:  Alert and oriented x 3, non-focal PSYCHIATRIC:  Normal affect, good insight  ASSESSMENT:    1. Primary hypertension   2. Morbid obesity with BMI of 40.0-44.9, adult ([place])   3. Mixed hyperlipidemia   4. Atypical chest pain     PLAN:    She is still having chest discomfort which I now suspect to be musculoskeletal.  I discussed with the patient that this is musculoskeletal and also the use of NSAIDs as well as Biofreeze.  She will also talk to her PCP.  She is hypertensive in office, will increase her Coreg to 6.25 mg twice daily.  The patient understands the need to lose weight with diet and exercise. We have discussed specific strategies for  this.  The patient is in agreement with the above plan. The patient left the office in stable condition.  The patient will follow up in 1 month or sooner if needed.   Medication Adjustments/Labs and Tests Ordered: Current medicines are reviewed at length with the patient today.  Concerns regarding medicines are outlined above.  No orders of the defined types were placed in this encounter.  Meds ordered this encounter  Medications   carvedilol (COREG) 6.25 MG tablet    Sig: Take 1 tablet (6.25 mg total) by mouth 2 (two) times daily.    Dispense:  180 tablet    Refill:  3    Patient Instructions  Medication Instructions:  Your physician has recommended you make the following change in your medication:  INCREASE: Coreg 6.25 mg twice daily  *If you need a refill on your cardiac medications before your next appointment, please call your pharmacy*   Lab Work: None   Testing/Procedures: None   Follow-Up: At [facility], you and your health needs are our priority.  As part of our continuing mission to provide you with exceptional heart care, we have created designated Provider Care Teams.  These Care Teams include your primary Cardiologist (physician) and Advanced Practice Providers (APPs -  Physician Assistants and Nurse Practitioners) who all work together to provide you with the care you need, when you need it.  We recommend signing up for the patient portal called "MyChart".  Sign up information is provided on this After Visit Summary.  MyChart is used to connect with patients for Virtual Visits (Telemedicine).  Patients are able to view lab/test results, encounter notes, upcoming appointments, etc.  Non-urgent messages can be sent to your provider as well.   To learn more about what you can do with MyChart, go to [email or web address].    Your next appointment:   6 month(s)  Provider:   Berniece Salines, DO     Other instructions: Get Biofreeze   Adopting a Healthy  Lifestyle.  Know what a healthy weight is for you (roughly BMI <25) and aim to maintain this   Aim for 7+ servings of fruits and vegetables daily   65-80+ fluid ounces of  water or unsweet tea for healthy kidneys   Limit to max 1 drink of alcohol per day; avoid smoking/tobacco   Limit animal fats in diet for cholesterol and heart health - choose grass fed whenever available   Avoid highly processed foods, and foods high in saturated/trans fats   Aim for low stress - take time to unwind and care for your mental health   Aim for 150 min of moderate intensity exercise weekly for heart health, and weights twice weekly for bone health   Aim for 7-9 hours of sleep daily   When it comes to diets, agreement about the perfect plan isnt easy to find, even among the experts. Experts at the [employer] developed an idea known as the Healthy Eating Plate. Just imagine a plate divided into logical, healthy portions.   The emphasis is on diet quality:   Load up on vegetables and fruits - one-half of your plate: Aim for color and variety, and remember that potatoes dont count.   Go for whole grains - one-quarter of your plate: Whole wheat, barley, wheat berries, quinoa, oats, brown rice, and foods made with them. If you want pasta, go with whole wheat pasta.   Protein power - one-quarter of your plate: Fish, chicken, beans, and nuts are all healthy, versatile protein sources. Limit red meat.   The diet, however, does go beyond the plate, offering a few other suggestions.   Use healthy plant oils, such as olive, canola, soy, corn, sunflower and peanut. Check the labels, and avoid partially hydrogenated oil, which have unhealthy trans fats.   If youre thirsty, drink water. Coffee and tea are good in moderation, but skip sugary drinks and limit milk and dairy products to one or two daily servings.   The type of carbohydrate in the diet is more important than the amount. Some  sources of carbohydrates, such as vegetables, fruits, whole grains, and beans-are healthier than others.   Finally, stay active  Signed, Berniece Salines, DO  04/12/2022 11:51 AM    [place]

## 2022-04-12 NOTE — Patient Instructions (Addendum)
Medication Instructions:  Your physician has recommended you make the following change in your medication:  INCREASE: Coreg 6.25 mg twice daily  *If you need a refill on your cardiac medications before your next appointment, please call your pharmacy*   Lab Work: None   Testing/Procedures: None   Follow-Up: At [facility], you and your health needs are our priority.  As part of our continuing mission to provide you with exceptional heart care, we have created designated Provider Care Teams.  These Care Teams include your primary Cardiologist (physician) and Advanced Practice Providers (APPs -  Physician Assistants and Nurse Practitioners) who all work together to provide you with the care you need, when you need it.  We recommend signing up for the patient portal called "MyChart".  Sign up information is provided on this After Visit Summary.  MyChart is used to connect with patients for Virtual Visits (Telemedicine).  Patients are able to view lab/test results, encounter notes, upcoming appointments, etc.  Non-urgent messages can be sent to your provider as well.   To learn more about what you can do with MyChart, go to [email or web address].    Your next appointment:   6 month(s)  Provider:   Berniece Salines, DO     Other instructions: Get Biofreeze

## 2022-05-20 ENCOUNTER — Other Ambulatory Visit: Payer: Self-pay | Admitting: *Deleted

## 2022-05-20 MED ORDER — NITROGLYCERIN 0.4 MG SL SUBL: 0.4000 mg | SUBLINGUAL_TABLET | SUBLINGUAL | 3 refills | Status: DC | PRN

## 2022-05-24 ENCOUNTER — Encounter: Payer: Self-pay | Admitting: *Deleted

## 2022-05-25 ENCOUNTER — Ambulatory Visit (INDEPENDENT_AMBULATORY_CARE_PROVIDER_SITE_OTHER): Payer: Medicaid Other | Admitting: Internal Medicine

## 2022-05-25 ENCOUNTER — Encounter: Payer: Self-pay | Admitting: Internal Medicine

## 2022-05-25 VITALS — BP 148/68 | HR 66 | Ht 66.0 in | Wt 250.0 lb

## 2022-05-25 DIAGNOSIS — R1013 Epigastric pain: Secondary | ICD-10-CM

## 2022-05-25 DIAGNOSIS — Z86010 Personal history of colonic polyps: Secondary | ICD-10-CM | POA: Diagnosis not present

## 2022-05-25 DIAGNOSIS — K5730 Diverticulosis of large intestine without perforation or abscess without bleeding: Secondary | ICD-10-CM

## 2022-05-25 MED ORDER — FAMOTIDINE 20 MG PO TABS: 20.0000 mg | ORAL_TABLET | Freq: Every day | ORAL | 2 refills | Status: DC

## 2022-05-25 MED ORDER — NA SULFATE-K SULFATE-MG SULF 17.5-3.13-1.6 GM/177ML PO SOLN: 1.0000 | Freq: Once | ORAL | 0 refills | Status: AC

## 2022-05-25 NOTE — Progress Notes (Signed)
Subjective:    Patient ID: Autumn [name], female    DOB: 1965-09-11, 57 y.o.   MRN: [id number]  HPI Autumn [name] is a 57 year old female with a past medical history of adenomatous colon polyps, colonic diverticulosis, dyspepsia, hypertension, hyperlipidemia, obesity, gallstones status postcholecystectomy, arthritis who is seen to reestablish care.  She is here today with her husband.  I performed an upper endoscopy and colonoscopy in 2014 for her. EGD revealed mild gastropathy which was inactive gastritis without H. pylori.  Esophagus and examined duodenum were normal Colonoscopy revealed 3 polyps which were all subcentimeter and found to be adenomatous.  There was mild diverticulosis throughout the colon.  In the past she has had some dyspeptic epigastric discomfort which responded to pantoprazole.  She has not taken acid suppression for quite some time.  Most recently she has noticed epigastric pain at night which can wake her from sleep.  She is not having heartburn or pyrosis.  No nausea or vomiting.  Good appetite.  No unintentional weight loss.  No dysphagia or odynophagia.  She has some occasional lower abdominal discomfort but this has been sporadic.  Bowel movements are every 3 to 4 days but this is no change for her.  Stools are not hard or difficult to pass.  She is seeing no blood in stool or melena.  She does have chronic bilateral knee pain and has been told she needs bilateral knee replacements.  She is trying to avoid this until absolutely necessary.  She also has chronic lower back pain but also wishes to avoid surgery. She uses diclofenac gel but not really diclofenac tablets because she is run out of this prescription and she reports "it did not help anyway".  Review of Systems As per HPI, otherwise negative  Current Medications, Allergies, Past Medical History, Past Surgical History, Family History and Social History were reviewed in [employer]  record.     Objective:   Physical Exam BP (!) 148/68   Pulse 66   Ht 5\' 6"  (1.676 m)   Wt 250 lb (113.4 kg)   BMI 40.35 kg/m  Gen: awake, alert, NAD HEENT: anicteric  Ext: no c/c/e Neuro: nonfocal     Latest Ref Rng & Units 01/18/2022    9:48 AM 06/02/2021   10:13 AM 04/14/2017    8:17 PM  CBC  WBC 3.4 - 10.8 x10E3/uL 7.5  7.0    Hemoglobin 11.1 - 15.9 g/dL 93.7  34.2  87.6   Hematocrit 34.0 - 46.6 % 49.8  47.1  47.0   Platelets 150 - 450 x10E3/uL 274  259     CMP     Component Value Date/Time   NA 140 01/18/2022 0948   K 4.3 01/18/2022 0948   CL 103 01/18/2022 0948   CO2 24 01/18/2022 0948   GLUCOSE 107 (H) 01/18/2022 0948   GLUCOSE 75 11/13/2020 1103   BUN 12 01/18/2022 0948   CREATININE 0.83 01/18/2022 0948   CALCIUM 9.7 01/18/2022 0948   PROT 8.3 06/20/2012 1727   ALBUMIN 4.1 06/20/2012 1727   AST 30 06/20/2012 1727   ALT 28 06/20/2012 1727   ALKPHOS 97 06/20/2012 1727   BILITOT 0.3 06/20/2012 1727   GFRNONAA >60 11/13/2020 1103   GFRAA >60 05/29/2016 1342   Lab Results  Component Value Date   VITAMINB12 307 06/02/2021         Assessment & Plan:  57 year old female with a past medical history of adenomatous colon polyps, colonic  diverticulosis, dyspepsia, hypertension, hyperlipidemia, obesity, gallstones status postcholecystectomy, arthritis who is seen to reestablish care.   Epigastric pain/dyspepsia --her dyspepsia is almost like a GERD equivalent for her though she is not having true pyrosis.  My notes indicate in the past this responded well to pantoprazole.  Given the intermittent nature of not sure she needs chronic daily PPI rather I will try her on famotidine at bedtime -- Famotidine 20 mg every afternoon; if not better she is asked to let me know -- Upper endoscopy previously without Barrett's esophagus or H. Pylori  2.  Personal history of adenomatous colon polyps --overdue for colonoscopy.  Colonoscopy recommended.  We reviewed the risk,  benefits and alternatives to colonoscopy and she is agreeable and wishes to proceed -- Colonoscopy in the [facility]  3.  Colonic diverticulosis --she has been treated for diverticulitis in the past.  No evidence for active inflammation at this time.

## 2022-05-25 NOTE — Patient Instructions (Addendum)
You have been scheduled for a colonoscopy. Please follow written instructions given to you at your visit today.  Please pick up your prep supplies at the pharmacy within the next 1-3 days. If you use inhalers (even only as needed), please bring them with you on the day of your procedure.  We have sent the following medications to your pharmacy for you to pick up at your convenience: pepcid _______________________________________________________  If your blood pressure at your visit was 140/90 or greater, please contact your primary care physician to follow up on this.  _______________________________________________________  If you are age 70 or older, your body mass index should be between 23-30. Your Body mass index is 40.35 kg/m. If this is out of the aforementioned range listed, please consider follow up with your Primary Care Provider.  If you are age 34 or younger, your body mass index should be between 19-25. Your Body mass index is 40.35 kg/m. If this is out of the aformentioned range listed, please consider follow up with your Primary Care Provider.   ________________________________________________________  The [place] [facility] providers would like to encourage you to use [facility] to communicate with providers for non-urgent requests or questions.  Due to long hold times on the telephone, sending your provider a message by [facility] may be a faster and more efficient way to get a response.  Please allow 48 business hours for a response.  Please remember that this is for non-urgent requests.  _______________________________________________________ It was a pleasure to see you today!  Thank you for trusting me with your gastrointestinal care!

## 2022-07-28 ENCOUNTER — Encounter: Payer: Medicaid Other | Admitting: Internal Medicine

## 2022-08-18 ENCOUNTER — Other Ambulatory Visit: Payer: Self-pay | Admitting: Internal Medicine

## 2022-08-18 ENCOUNTER — Encounter: Payer: Self-pay | Admitting: Internal Medicine

## 2022-10-04 IMAGING — MG DIGITAL SCREENING BILAT W/ TOMO W/ CAD
8 series · 8 of 24 positions shown · non-contrast
Comparison: None.

CLINICAL DATA: Screening.

EXAM:
DIGITAL SCREENING BILATERAL MAMMOGRAM WITH TOMO AND CAD

[R MLO synth-2D]
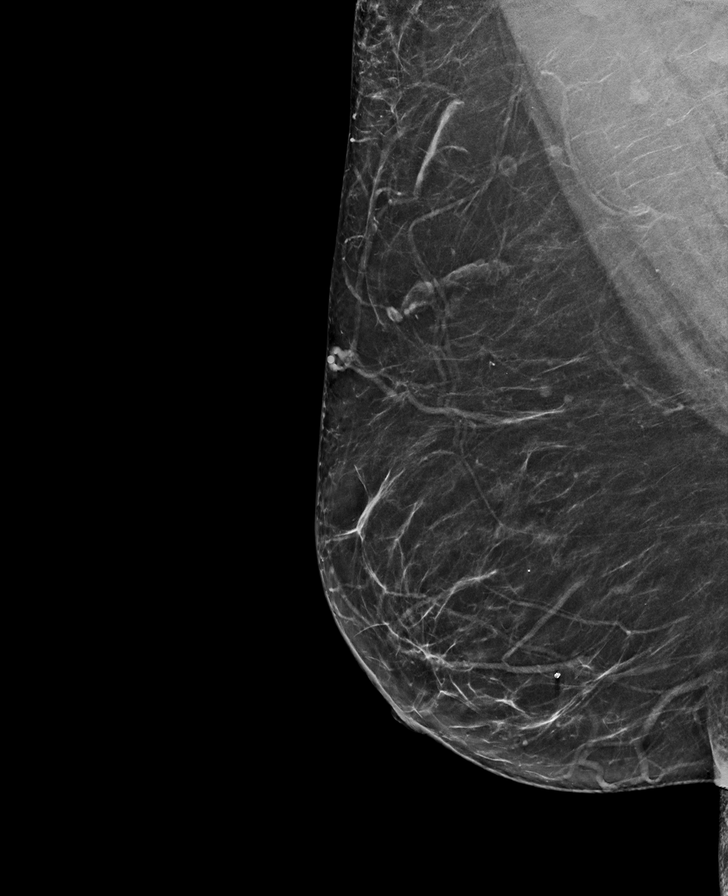

[L MLO synth-2D]
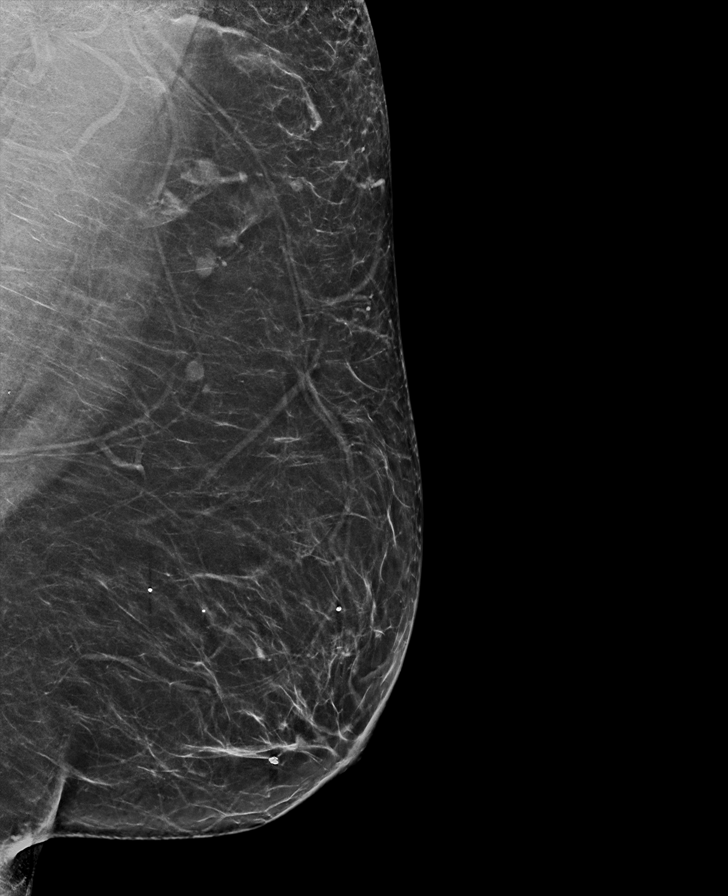

[R CC synth-2D]
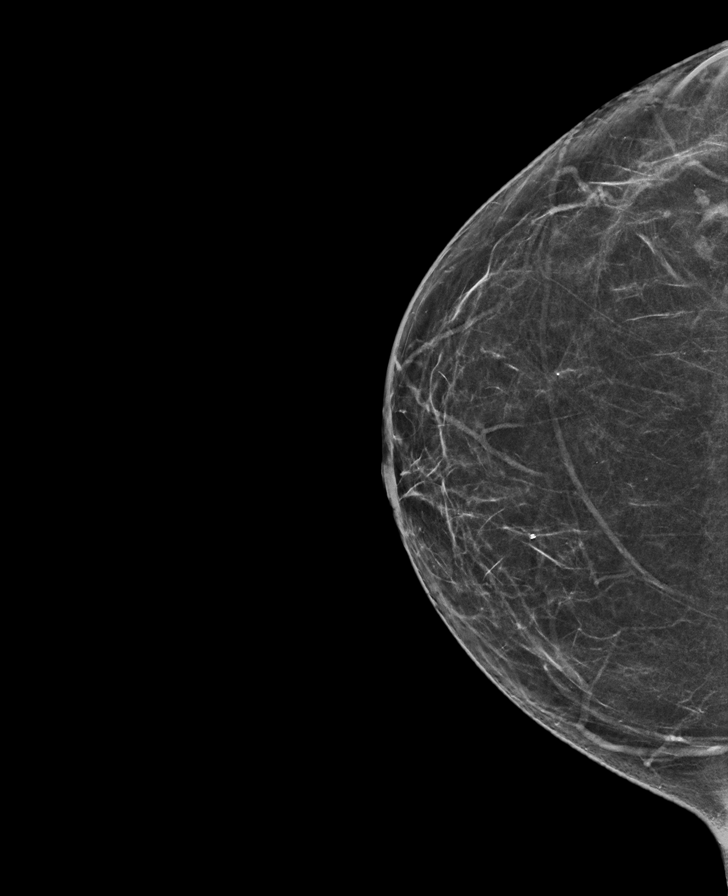

[L CC synth-2D]
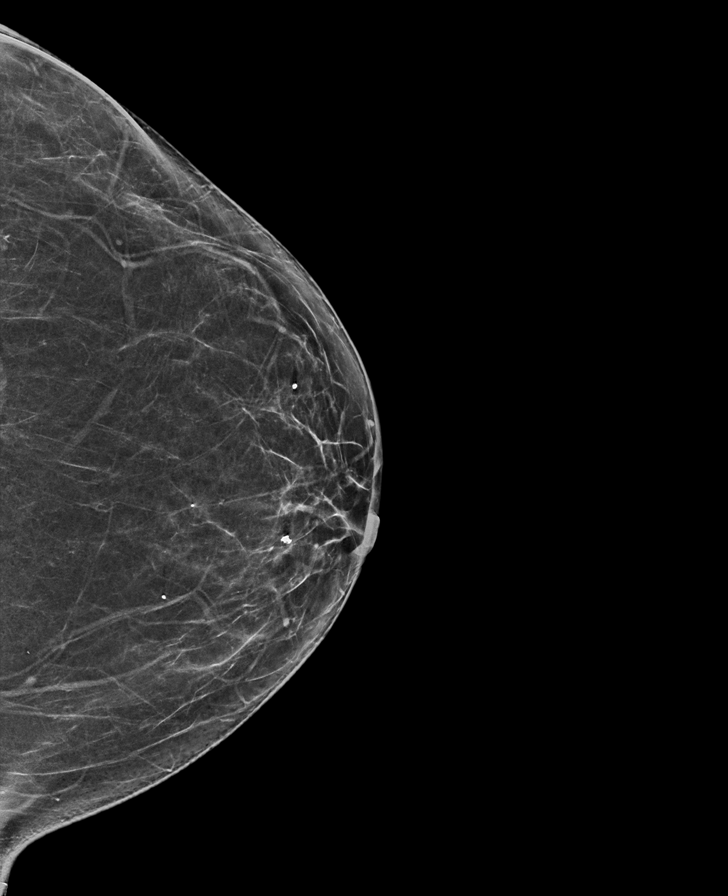

[R CC tomo · tomo slice 33/64.0]
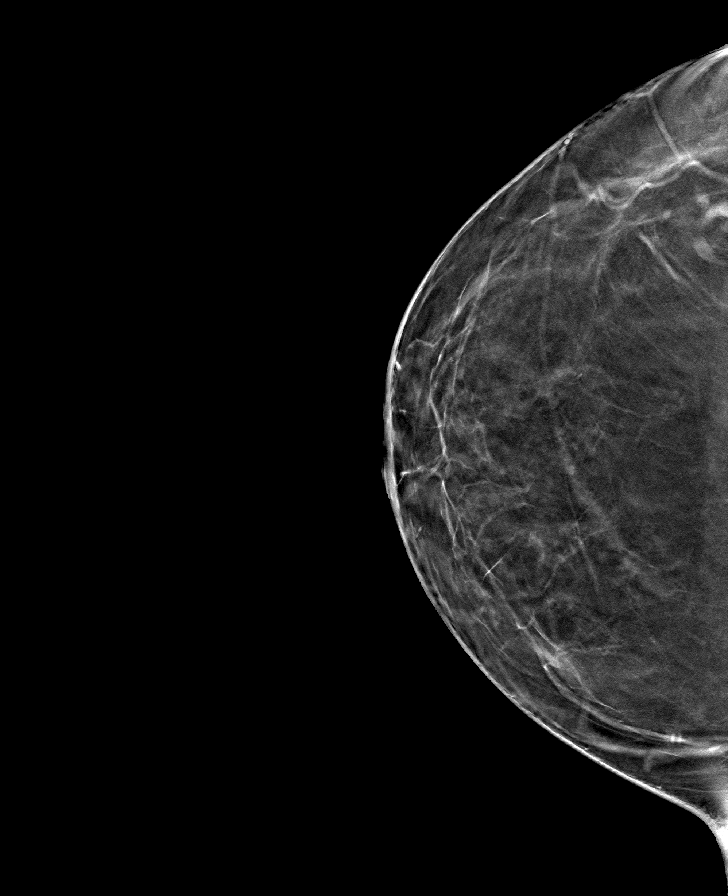

[L CC tomo · tomo slice 34/67.0]
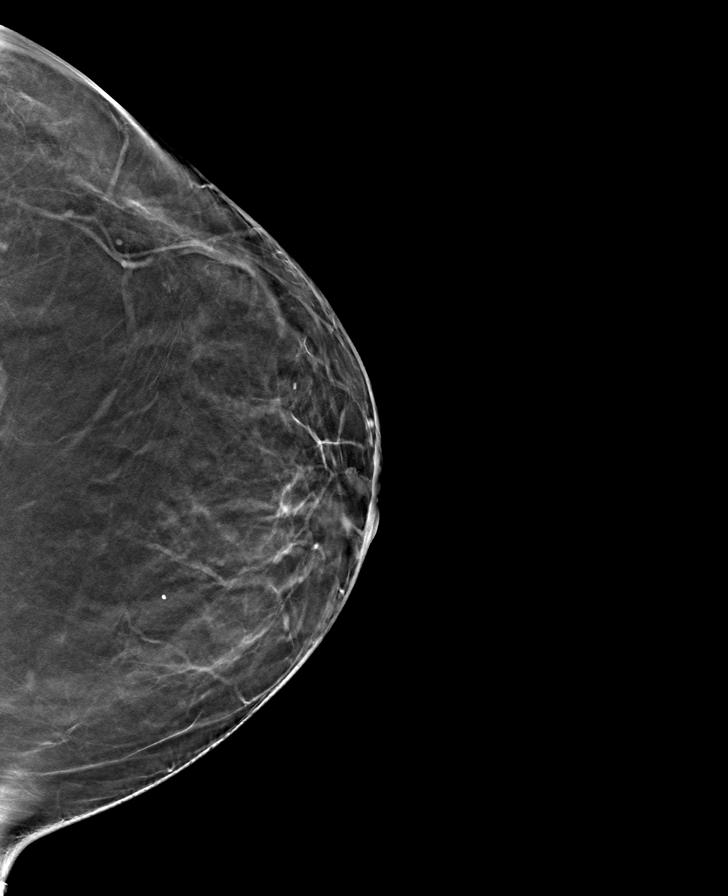

[R MLO tomo · tomo slice 35/68.0]
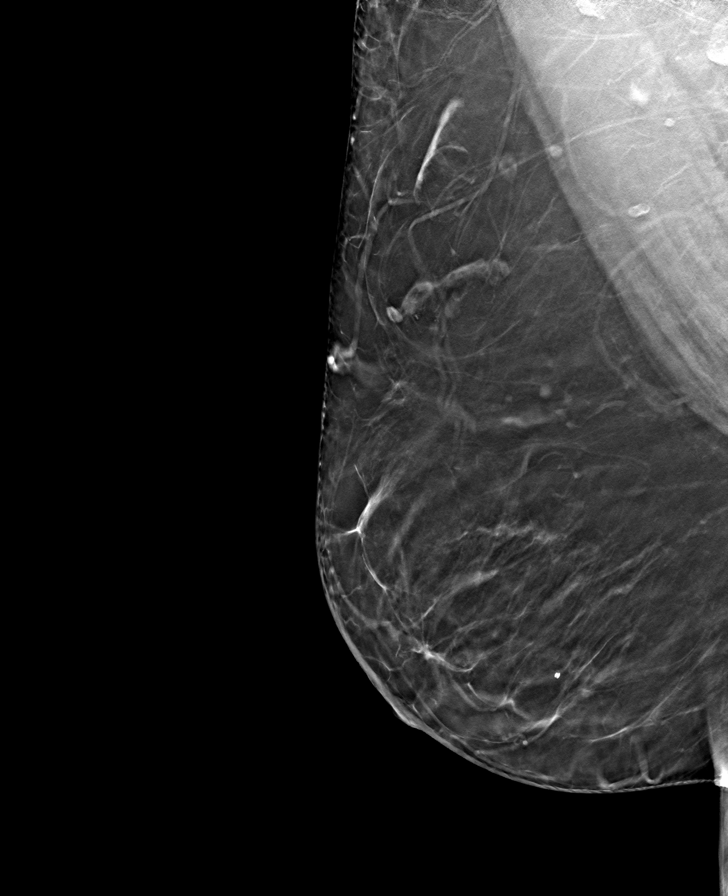

[L MLO tomo · tomo slice 37/72.0]
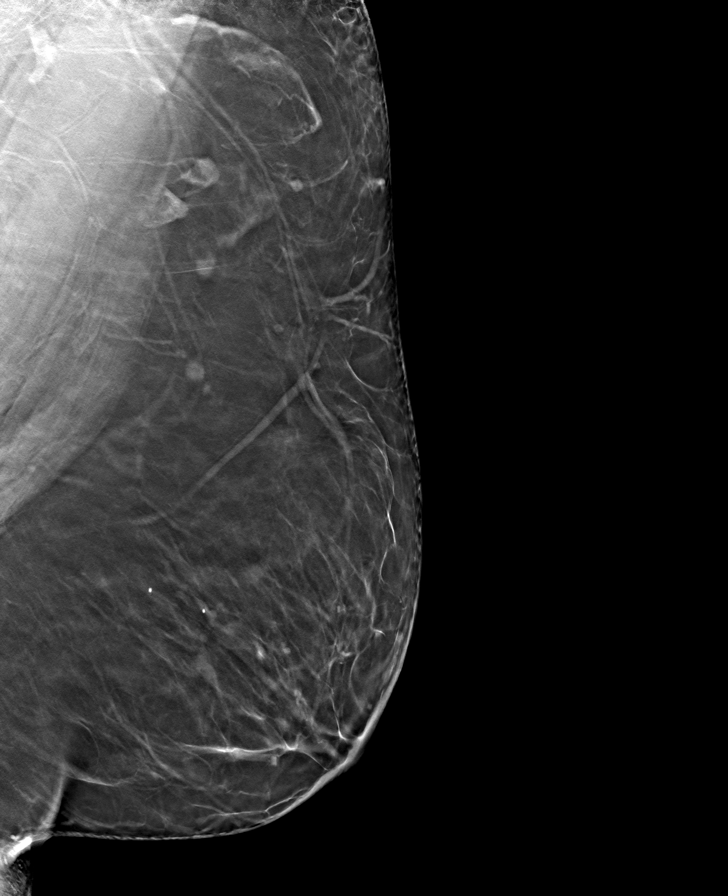

[8 of 24 positions shown; findings below may reference images not displayed]

ACR Breast Density Category b: There are scattered areas of
fibroglandular density.
FINDINGS: There are no findings suspicious for malignancy. Images were
processed with CAD.
IMPRESSION: No mammographic evidence of malignancy. A result letter of this
screening mammogram will be mailed directly to the patient.

RECOMMENDATION:
Screening mammogram in one year. (Code:Y5-G-EJ6)

BI-RADS CATEGORY  1: Negative.

## 2022-10-11 ENCOUNTER — Ambulatory Visit: Payer: Medicaid Other | Admitting: Cardiology

## 2022-11-15 ENCOUNTER — Other Ambulatory Visit: Payer: Self-pay | Admitting: Internal Medicine

## 2022-11-16 ENCOUNTER — Ambulatory Visit (AMBULATORY_SURGERY_CENTER): Payer: Self-pay

## 2022-11-16 VITALS — Ht 66.0 in | Wt 252.0 lb

## 2022-11-16 DIAGNOSIS — Z860100 Personal history of colon polyps, unspecified: Secondary | ICD-10-CM

## 2022-11-16 MED ORDER — PEG 3350-KCL-NA BICARB-NACL 420 G PO SOLR: 4000.0000 mL | Freq: Once | ORAL | 0 refills | Status: AC

## 2022-11-16 NOTE — Progress Notes (Signed)
No egg or soy allergy known to patient  No issues known to pt with past sedation with any surgeries or procedures Patient denies ever being told they had issues or difficulty with intubation  No FH of Malignant Hyperthermia Pt is not on diet pills Pt is not on  home 02  Pt is not on blood thinners  Pt denies issues with constipation  No A fib or A flutter - reports bradycardia  Have any cardiac testing pending--no  LOA: independent  Prep: Golytely   Patient's chart reviewed by Cathlyn Parsons CNRA prior to previsit and patient appropriate for the [facility].  Previsit completed and red dot placed by patient's name on their procedure day (on provider's schedule).     PV competed with patient. Prep instructions sent via mychart and home address.

## 2022-11-23 ENCOUNTER — Encounter: Payer: Self-pay | Admitting: Internal Medicine

## 2022-11-25 DIAGNOSIS — E785 Hyperlipidemia, unspecified: Secondary | ICD-10-CM | POA: Diagnosis not present

## 2022-12-02 DIAGNOSIS — Z23 Encounter for immunization: Secondary | ICD-10-CM | POA: Diagnosis not present

## 2022-12-02 DIAGNOSIS — G2581 Restless legs syndrome: Secondary | ICD-10-CM | POA: Diagnosis not present

## 2022-12-02 DIAGNOSIS — I10 Essential (primary) hypertension: Secondary | ICD-10-CM | POA: Diagnosis not present

## 2022-12-02 DIAGNOSIS — Z6839 Body mass index (BMI) 39.0-39.9, adult: Secondary | ICD-10-CM | POA: Diagnosis not present

## 2022-12-02 DIAGNOSIS — E785 Hyperlipidemia, unspecified: Secondary | ICD-10-CM | POA: Diagnosis not present

## 2022-12-06 ENCOUNTER — Ambulatory Visit: Payer: PPO | Admitting: Internal Medicine

## 2022-12-06 ENCOUNTER — Encounter: Payer: Self-pay | Admitting: Internal Medicine

## 2022-12-06 VITALS — BP 123/66 | HR 55 | Temp 98.9°F | Resp 13 | Ht 66.0 in | Wt 252.0 lb

## 2022-12-06 DIAGNOSIS — K635 Polyp of colon: Secondary | ICD-10-CM

## 2022-12-06 DIAGNOSIS — D127 Benign neoplasm of rectosigmoid junction: Secondary | ICD-10-CM

## 2022-12-06 DIAGNOSIS — Z09 Encounter for follow-up examination after completed treatment for conditions other than malignant neoplasm: Secondary | ICD-10-CM | POA: Diagnosis not present

## 2022-12-06 DIAGNOSIS — D123 Benign neoplasm of transverse colon: Secondary | ICD-10-CM

## 2022-12-06 DIAGNOSIS — Z860100 Personal history of colon polyps, unspecified: Secondary | ICD-10-CM

## 2022-12-06 DIAGNOSIS — D122 Benign neoplasm of ascending colon: Secondary | ICD-10-CM | POA: Diagnosis not present

## 2022-12-06 DIAGNOSIS — I10 Essential (primary) hypertension: Secondary | ICD-10-CM | POA: Diagnosis not present

## 2022-12-06 DIAGNOSIS — Z860101 Personal history of adenomatous and serrated colon polyps: Secondary | ICD-10-CM | POA: Diagnosis not present

## 2022-12-06 MED ORDER — SODIUM CHLORIDE 0.9 % IV SOLN: 500.0000 mL | INTRAVENOUS | Status: AC

## 2022-12-06 NOTE — Op Note (Signed)
[facility] Patient Name: Autumn [name] Procedure Date: 12/06/2022 10:41 AM MRN: [id number] Endoscopist: Beverley Fiedler , MD, [id number] Age: 57 Referring MD:  Date of Birth: [birth year] Gender: Female Account #: [id number] Procedure:                Colonoscopy Indications:              High risk colon cancer surveillance: Personal                            history of multiple (3 or more) adenomas, Last                            colonoscopy: 2014 Medicines:                Monitored Anesthesia Care Procedure:                Pre-Anesthesia Assessment:                           - Prior to the procedure, a History and Physical                            was performed, and patient medications and                            allergies were reviewed. The patient's tolerance of                            previous anesthesia was also reviewed. The risks                            and benefits of the procedure and the sedation                            options and risks were discussed with the patient.                            All questions were answered, and informed consent                            was obtained. Prior Anticoagulants: The patient has                            taken no anticoagulant or antiplatelet agents. ASA                            Grade Assessment: II - A patient with mild systemic                            disease. After reviewing the risks and benefits,                            the patient was deemed in satisfactory condition to  undergo the procedure.                           After obtaining informed consent, the colonoscope                            was passed under direct vision. Throughout the                            procedure, the patient's blood pressure, pulse, and                            oxygen saturations were monitored continuously. The                            CF [id number] #[id number] was introduced through the  anus                            and advanced to the cecum, identified by the                            ileocecal valve. The colonoscopy was performed                            without difficulty. The patient tolerated the                            procedure well. The quality of the bowel                            preparation was good. The ileocecal valve,                            appendiceal orifice, and rectum were photographed. Scope In: 10:54:39 AM Scope Out: 11:12:36 AM Scope Withdrawal Time: 0 hours 15 minutes 52 seconds  Total Procedure Duration: 0 hours 17 minutes 57 seconds  Findings:                 The digital rectal exam was normal.                           A 3 mm polyp was found in the ascending colon. The                            polyp was sessile. The polyp was removed with a                            cold snare. Resection and retrieval were complete.                           A 4 mm polyp was found in the transverse colon. The                            polyp was sessile. The polyp  was removed with a                            cold snare. Resection and retrieval were complete.                           A 4 mm polyp was found in the recto-sigmoid colon.                            The polyp was sessile. The polyp was removed with a                            cold snare. Resection and retrieval were complete.                           Multiple medium-mouthed and small-mouthed                            diverticula were found in the sigmoid colon,                            descending colon and transverse colon.                           Internal hemorrhoids were found during                            retroflexion. The hemorrhoids were small. Complications:            No immediate complications. Estimated Blood Loss:     Estimated blood loss was minimal. Impression:               - One 3 mm polyp in the ascending colon, removed                            with a cold  snare. Resected and retrieved.                           - One 4 mm polyp in the transverse colon, removed                            with a cold snare. Resected and retrieved.                           - One 4 mm polyp at the recto-sigmoid colon,                            removed with a cold snare. Resected and retrieved.                           - Moderate diverticulosis in the sigmoid colon, in                            the descending colon and in the transverse colon.                           -  Small internal hemorrhoids. Recommendation:           - Patient has a contact number available for                            emergencies. The signs and symptoms of potential                            delayed complications were discussed with the                            patient. Return to normal activities tomorrow.                            Written discharge instructions were provided to the                            patient.                           - Resume previous diet.                           - Continue present medications.                           - Await pathology results.                           - Repeat colonoscopy is recommended for                            surveillance. The colonoscopy date will be                            determined after pathology results from today's                            exam become available for review. Beverley Fiedler, MD 12/06/2022 11:15:53 AM This report has been signed electronically.

## 2022-12-06 NOTE — Progress Notes (Signed)
Called to room to assist during endoscopic procedure.  Patient ID and intended procedure confirmed with present staff. Received instructions for my participation in the procedure from the performing physician.  

## 2022-12-06 NOTE — Patient Instructions (Signed)
Thank you for letting us take care of your healthcare needs today. Please see handouts given to you on Polyps, Diverticulosis and Hemorrhoids.     YOU HAD AN ENDOSCOPIC PROCEDURE TODAY AT THE [facility]:   Refer to the procedure report that was given to you for any specific questions about what was found during the examination.  If the procedure report does not answer your questions, please call your gastroenterologist to clarify.  If you requested that your care partner not be given the details of your procedure findings, then the procedure report has been included in a sealed envelope for you to review at your convenience later.  YOU SHOULD EXPECT: Some feelings of bloating in the abdomen. Passage of more gas than usual.  Walking can help get rid of the air that was put into your GI tract during the procedure and reduce the bloating. If you had a lower endoscopy (such as a colonoscopy or flexible sigmoidoscopy) you may notice spotting of blood in your stool or on the toilet paper. If you underwent a bowel prep for your procedure, you may not have a normal bowel movement for a few days.  Please Note:  You might notice some irritation and congestion in your nose or some drainage.  This is from the oxygen used during your procedure.  There is no need for concern and it should clear up in a day or so.  SYMPTOMS TO REPORT IMMEDIATELY:  Following lower endoscopy (colonoscopy or flexible sigmoidoscopy):  Excessive amounts of blood in the stool  Significant tenderness or worsening of abdominal pains  Swelling of the abdomen that is new, acute  Fever of 100F or higher   For urgent or emergent issues, a gastroenterologist can be reached at any hour by calling [phone]. Do not use MyChart messaging for urgent concerns.    DIET:  We do recommend a small meal at first, but then you may proceed to your regular diet.  Drink plenty of fluids but you should avoid alcoholic beverages  for 24 hours.  ACTIVITY:  You should plan to take it easy for the rest of today and you should NOT DRIVE or use heavy machinery until tomorrow (because of the sedation medicines used during the test).    FOLLOW UP: Our staff will call the number listed on your records the next business day following your procedure.  We will call around 7:15- 8:00 am to check on you and address any questions or concerns that you may have regarding the information given to you following your procedure. If we do not reach you, we will leave a message.     If any biopsies were taken you will be contacted by phone or by letter within the next 1-3 weeks.  Please call us at [phone] if you have not heard about the biopsies in 3 weeks.    SIGNATURES/CONFIDENTIALITY: You and/or your care partner have signed paperwork which will be entered into your electronic medical record.  These signatures attest to the fact that that the information above on your After Visit Summary has been reviewed and is understood.  Full responsibility of the confidentiality of this discharge information lies with you and/or your care-partner. 

## 2022-12-06 NOTE — Progress Notes (Signed)
GASTROENTEROLOGY PROCEDURE H&P NOTE   Primary Care Physician: Abner Greenspan, MD    Reason for Procedure:  History of adenomatous colon polyps  Plan:    Colonoscopy  Patient is appropriate for endoscopic procedure(s) in the ambulatory ([facility]) setting.  The nature of the procedure, as well as the risks, benefits, and alternatives were carefully and thoroughly reviewed with the patient. Ample time for discussion and questions allowed. The patient understood, was satisfied, and agreed to proceed.     HPI: Autumn [name] is a 57 y.o. female who presents for colonoscopy.  Medical history as below.  Tolerated the prep.  No recent chest pain or shortness of breath.  No abdominal pain today.  Past Medical History:  Diagnosis Date   Abdominal pain, acute, epigastric 04/16/2012   Arthritis    Chest pain on exertion 04/16/2012   Diverticulitis    last episode the sat before thanksgiving   Diverticulosis    Gallstones    History of kidney stones    Hx of adenomatous colonic polyps 06/23/2012   March 2012 - ascending colon polyp 1.2 cm = tubular adenoma (repeat March 2015)   Hypertension    Lower abdominal pain 08/25/2020   Morbid obesity with BMI of 40.0-44.9, adult (HCC) 04/16/2012   Osteoarthritis    Pre-diabetes     Past Surgical History:  Procedure Laterality Date   bladder tack  02/15/2005   CHOLECYSTECTOMY     KNEE ARTHROSCOPY WITH MENISCAL REPAIR Left 11/19/2020   Procedure: LEFT KNEE ARTHROSCOPY PARTIAL MEDIAL MENISCECTOMY;  Surgeon: Tarry Kos, MD;  Location: [facility];  Service: Orthopedics;  Laterality: Left;   LAPAROSCOPIC TOTAL HYSTERECTOMY  2006   LEFT HEART CATH AND CORONARY ANGIOGRAPHY N/A 01/25/2022   Procedure: LEFT HEART CATH AND CORONARY ANGIOGRAPHY;  Surgeon: Tonny Bollman, MD;  Location: [facility] INVASIVE CV [facility];  Service: Cardiovascular;  Laterality: N/A;    Prior to Admission medications   Medication Sig Start Date End Date Taking?  Authorizing Provider  acetaminophen (TYLENOL) 325 MG tablet Take 650 mg by mouth every 6 (six) hours as needed for mild pain.   Yes [provider]  aspirin EC 81 MG tablet Take 1 tablet (81 mg total) by mouth daily. Swallow whole. 01/18/22  Yes Tobb, Kardie, DO  carvedilol (COREG) 6.25 MG tablet Take 1 tablet (6.25 mg total) by mouth 2 (two) times daily. 04/12/22  Yes Tobb, Kardie, DO  famotidine (PEPCID) 20 MG tablet TAKE 1 TABLET BY MOUTH AT BEDTIME 11/15/22  Yes Delmi Fulfer, Carie Caddy, MD  hydrochlorothiazide (HYDRODIURIL) 25 MG tablet Take 25 mg by mouth daily.   Yes [provider]  rOPINIRole (REQUIP) 0.5 MG tablet Take 0.5 mg by mouth at bedtime. 12/02/22  Yes [provider]  rosuvastatin (CRESTOR) 5 MG tablet Take 1 tablet (5 mg total) by mouth daily. 06/02/21  Yes Tobb, Kardie, DO  diclofenac (VOLTAREN) 75 MG EC tablet Take 75 mg by mouth 2 (two) times daily as needed for moderate pain. Patient not taking: Reported on 11/16/2022    [provider]  diclofenac Sodium (VOLTAREN) 1 % GEL Apply 2 g topically 4 (four) times daily as needed for pain.    [provider]  Menthol, Topical Analgesic, (ICY HOT EX) Apply 1 Application topically daily as needed (pain).    [provider]  metoCLOPramide (REGLAN) 5 MG tablet Take 5 mg by mouth as needed for nausea or vomiting.    [provider]  nitroGLYCERIN (NITROSTAT)  0.4 MG SL tablet Place 1 tablet (0.4 mg total) under the tongue every 5 (five) minutes as needed for chest pain. Patient not taking: Reported on 12/06/2022 05/20/22 08/18/22  Thomasene Ripple, DO    Current Outpatient Medications  Medication Sig Dispense Refill   acetaminophen (TYLENOL) 325 MG tablet Take 650 mg by mouth every 6 (six) hours as needed for mild pain.     aspirin EC 81 MG tablet Take 1 tablet (81 mg total) by mouth daily. Swallow whole. 90 tablet 3   carvedilol (COREG) 6.25 MG tablet Take 1 tablet (6.25 mg total) by mouth 2  (two) times daily. 180 tablet 3   famotidine (PEPCID) 20 MG tablet TAKE 1 TABLET BY MOUTH AT BEDTIME 90 tablet 0   hydrochlorothiazide (HYDRODIURIL) 25 MG tablet Take 25 mg by mouth daily.     rOPINIRole (REQUIP) 0.5 MG tablet Take 0.5 mg by mouth at bedtime.     rosuvastatin (CRESTOR) 5 MG tablet Take 1 tablet (5 mg total) by mouth daily. 90 tablet 3   diclofenac (VOLTAREN) 75 MG EC tablet Take 75 mg by mouth 2 (two) times daily as needed for moderate pain. (Patient not taking: Reported on 11/16/2022)     diclofenac Sodium (VOLTAREN) 1 % GEL Apply 2 g topically 4 (four) times daily as needed for pain.     Menthol, Topical Analgesic, (ICY HOT EX) Apply 1 Application topically daily as needed (pain).     metoCLOPramide (REGLAN) 5 MG tablet Take 5 mg by mouth as needed for nausea or vomiting.     nitroGLYCERIN (NITROSTAT) 0.4 MG SL tablet Place 1 tablet (0.4 mg total) under the tongue every 5 (five) minutes as needed for chest pain. (Patient not taking: Reported on 12/06/2022) 90 tablet 3   Current Facility-Administered Medications  Medication Dose Route Frequency Provider Last Rate Last Admin   0.9 %  sodium chloride infusion  500 mL Intravenous Continuous Gavin Telford, Carie Caddy, MD        Allergies as of 12/06/2022   (No Known Allergies)    Family History  Problem Relation Age of Onset   Hypertension Mother    Diabetes Mother    Lymphoma Mother    Thyroid disease Mother    Brain cancer Mother    Lung cancer Father    Heart murmur Brother    Pancreatic cancer Paternal Aunt    Cancer Paternal Aunt        unknown type   Stomach cancer Paternal Uncle    Dementia Maternal Grandfather    Colon cancer Neg Hx    Rectal cancer Neg Hx     Social History   Socioeconomic History   Marital status: Married    Spouse name: Not on file   Number of children: 1   Years of education: Not on file   Highest education level: Not on file  Occupational History   Occupation: ASSOCIATE    Employer:  [employer]  Tobacco Use   Smoking status: Never   Smokeless tobacco: Never  Substance and Sexual Activity   Alcohol use: No   Drug use: No   Sexual activity: Not on file  Other Topics Concern   Not on file  Social History Narrative   Not on file   Social Determinants of Health   Financial Resource Strain: Not on File (09/14/2021)   Received from [employer]    Financial Resource Strain: 0  Food Insecurity: Not on File (09/14/2021)  [place]   Food Insecurity    Food: 0  Transportation Needs: Not on File (09/14/2021)   Received from [facility] Needs    Transportation: 0  Physical Activity: Not on File (09/14/2021)   Received from [facility]   Physical Activity    Physical Activity: 0  Recent Concern: Physical Activity - At Risk (09/14/2021)   Received from [facility]   Physical Activity    Physical Activity: 2  Stress: Not on File (09/14/2021)   Received from [facility]   Stress    Stress: 0  Social Connections: Not on File (09/14/2021)   Received from [employer] [facility]   Social Connections    Connectedness: 0  Intimate Partner Violence: Not on file    Physical Exam: Vital signs in last 24 hours: @BP  (!) 139/95   Pulse 62   Temp 98.9 F (37.2 C)   Ht 5\' 6"  (1.676 m)   Wt 252 lb (114.3 kg)   SpO2 (!) 89%   BMI 40.67 kg/m  GEN: NAD EYE: Sclerae anicteric ENT: MMM CV: Non-tachycardic Pulm: CTA b/l GI: Soft, NT/ND NEURO:  Alert & Oriented x 3   Erick Blinks, MD [facility]  12/06/2022 10:43 AM

## 2022-12-06 NOTE — Progress Notes (Signed)
Sedate, gd SR, tolerated procedure well, VSS, report to RN 

## 2022-12-06 NOTE — Progress Notes (Signed)
Pt's states no medical or surgical changes since previsit or office visit. 

## 2022-12-07 ENCOUNTER — Telehealth: Payer: Self-pay

## 2022-12-07 NOTE — Telephone Encounter (Signed)
Follow up call to pt, lm for pt to call if having any difficulty with normal activities or eating and drinking.  Also to call if any other questions or concerns.  

## 2022-12-08 LAB — SURGICAL PATHOLOGY

## 2022-12-09 ENCOUNTER — Ambulatory Visit: Payer: PPO | Attending: Cardiology | Admitting: Cardiology

## 2022-12-09 ENCOUNTER — Encounter: Payer: Self-pay | Admitting: Internal Medicine

## 2022-12-09 VITALS — BP 138/84 | HR 46 | Ht 67.0 in | Wt 253.8 lb

## 2022-12-09 DIAGNOSIS — E782 Mixed hyperlipidemia: Secondary | ICD-10-CM

## 2022-12-09 DIAGNOSIS — R0789 Other chest pain: Secondary | ICD-10-CM | POA: Diagnosis not present

## 2022-12-09 MED ORDER — ROSUVASTATIN CALCIUM 10 MG PO TABS: 10.0000 mg | ORAL_TABLET | Freq: Every day | ORAL | 3 refills | Status: AC

## 2022-12-09 NOTE — Patient Instructions (Signed)
Medication Instructions:  Increase Crestor to 10 mg *If you need a refill on your cardiac medications before your next appointment, please call your pharmacy*  Follow-Up: At [facility], you and your health needs are our priority.  As part of our continuing mission to provide you with exceptional heart care, we have created designated Provider Care Teams.  These Care Teams include your primary Cardiologist (physician) and Advanced Practice Providers (APPs -  Physician Assistants and Nurse Practitioners) who all work together to provide you with the care you need, when you need it.  We recommend signing up for the patient portal called "MyChart".  Sign up information is provided on this After Visit Summary.  MyChart is used to connect with patients for Virtual Visits (Telemedicine).  Patients are able to view lab/test results, encounter notes, upcoming appointments, etc.  Non-urgent messages can be sent to your provider as well.   To learn more about what you can do with MyChart, go to [email or web address].    Your next appointment:   9 month(s)  Provider:   Thomasene Ripple, DO

## 2022-12-09 NOTE — Progress Notes (Signed)
Cardiology Office Note:    Date:  12/10/2022   ID:  Autumn [name], DOB [birth year], MRN [id number]  PCP:  Abner Greenspan, MD  Cardiologist:  Thomasene Ripple, DO  Electrophysiologist:  None   Referring MD: Yvonne Kendall, NP   " I am short of breath"  History of Present Illness:    Autumn [name] is a 57 y.o. female with a hx of  prediabetes, hypertension, obesity, OSA  is here today for follow-up visit.   She reports doing well overall. She has been discussing potential knee surgery with her primary care provider, which is now more likely due to recently obtaining disability insurance. The patient is scheduled to see her primary care provider in November for a complete physical and a referral for the knee surgery. She also mentions experiencing pain in her leg, for which her primary care provider plans to conduct a test to check blood flow.  Past Medical History:  Diagnosis Date   Abdominal pain, acute, epigastric 04/16/2012   Arthritis    Chest pain on exertion 04/16/2012   Diverticulitis    last episode the sat before thanksgiving   Diverticulosis    Gallstones    History of kidney stones    Hx of adenomatous colonic polyps 06/23/2012   March 2012 - ascending colon polyp 1.2 cm = tubular adenoma (repeat March 2015)   Hypertension    Lower abdominal pain 08/25/2020   Morbid obesity with BMI of 40.0-44.9, adult (HCC) 04/16/2012   Osteoarthritis    Pre-diabetes     Past Surgical History:  Procedure Laterality Date   bladder tack  02/15/2005   CHOLECYSTECTOMY     KNEE ARTHROSCOPY WITH MENISCAL REPAIR Left 11/19/2020   Procedure: LEFT KNEE ARTHROSCOPY PARTIAL MEDIAL MENISCECTOMY;  Surgeon: Tarry Kos, MD;  Location: [facility];  Service: Orthopedics;  Laterality: Left;   LAPAROSCOPIC TOTAL HYSTERECTOMY  2006   LEFT HEART CATH AND CORONARY ANGIOGRAPHY N/A 01/25/2022   Procedure: LEFT HEART CATH AND CORONARY ANGIOGRAPHY;  Surgeon: Tonny Bollman, MD;  Location: [facility]  INVASIVE CV LAB;  Service: Cardiovascular;  Laterality: N/A;    Current Medications: Current Meds  Medication Sig   acetaminophen (TYLENOL) 325 MG tablet Take 650 mg by mouth every 6 (six) hours as needed for mild pain.   aspirin EC 81 MG tablet Take 1 tablet (81 mg total) by mouth daily. Swallow whole.   carvedilol (COREG) 6.25 MG tablet Take 1 tablet (6.25 mg total) by mouth 2 (two) times daily.   diclofenac Sodium (VOLTAREN) 1 % GEL Apply 2 g topically 4 (four) times daily as needed for pain.   famotidine (PEPCID) 20 MG tablet TAKE 1 TABLET BY MOUTH AT BEDTIME   hydrochlorothiazide (HYDRODIURIL) 25 MG tablet Take 25 mg by mouth daily.   Menthol, Topical Analgesic, (ICY HOT EX) Apply 1 Application topically daily as needed (pain).   rOPINIRole (REQUIP) 0.5 MG tablet Take 0.5 mg by mouth at bedtime.   [DISCONTINUED] rosuvastatin (CRESTOR) 5 MG tablet Take 1 tablet (5 mg total) by mouth daily.     Allergies:   Patient has no known allergies.   Social History   Socioeconomic History   Marital status: Married    Spouse name: Not on file   Number of children: 1   Years of education: Not on file   Highest education level: Not on file  Occupational History   Occupation: ASSOCIATE    Employer: [id number]  Tobacco Use   Smoking status:  Never   Smokeless tobacco: Never  Substance and Sexual Activity   Alcohol use: No   Drug use: No   Sexual activity: Not on file  Other Topics Concern   Not on file  Social History Narrative   Not on file   Social Determinants of Health   Financial Resource Strain: Not on File (09/14/2021)   Received from [employer]    Financial Resource Strain: 0  Food Insecurity: Not on File (09/14/2021)   Received from [place], [place], [place]   Food Insecurity    Food: 0  Transportation Needs: Not on File (09/14/2021)   Received from [employer] [facility]    Transportation: 0  Physical Activity: Not on File (09/14/2021)   Received  from [facility]   Physical Activity    Physical Activity: 0  Recent Concern: Physical Activity - At Risk (09/14/2021)   Received from [facility]   Physical Activity    Physical Activity: 2  Stress: Not on File (09/14/2021)   Received from [facility]   Stress    Stress: 0  Social Connections: Not on File (09/14/2021)   Received from [employer] [facility]   Social Connections    Connectedness: 0     Family History: The patient's family history includes Brain cancer in her mother; Cancer in her paternal aunt; Dementia in her maternal grandfather; Diabetes in her mother; Heart murmur in her brother; Hypertension in her mother; Lung cancer in her father; Lymphoma in her mother; Pancreatic cancer in her paternal aunt; Stomach cancer in her paternal uncle; Thyroid disease in her mother. There is no history of Colon cancer or Rectal cancer.  ROS:   Review of Systems  Constitution: Negative for decreased appetite, fever and weight gain.  HENT: Negative for congestion, ear discharge, hoarse voice and sore throat.   Eyes: Negative for discharge, redness, vision loss in right eye and visual halos.  Cardiovascular: Negative for chest pain, dyspnea on exertion, leg swelling, orthopnea and palpitations.  Respiratory: Negative for cough, hemoptysis, shortness of breath and snoring.   Endocrine: Negative for heat intolerance and polyphagia.  Hematologic/Lymphatic: Negative for bleeding problem. Does not bruise/bleed easily.  Skin: Negative for flushing, nail changes, rash and suspicious lesions.  Musculoskeletal: Negative for arthritis, joint pain, muscle cramps, myalgias, neck pain and stiffness.  Gastrointestinal: Negative for abdominal pain, bowel incontinence, diarrhea and excessive appetite.  Genitourinary: Negative for decreased libido, genital sores and incomplete emptying.  Neurological: Negative for brief paralysis, focal weakness, headaches and loss of balance.  Psychiatric/Behavioral: Negative for altered mental status,  depression and suicidal ideas.  Allergic/Immunologic: Negative for HIV exposure and persistent infections.    EKGs/Labs/Other Studies Reviewed:    The following studies were reviewed today:   EKG: None today  Left heart catheterization 01/25/2022 1.  Patent coronary arteries, right dominant anatomy, with no significant stenosis throughout the coronary tree.  Minimal luminal irregularities are noted. 2.  Normal LVEDP with known normal LV function by noninvasive assessment in the past Suspect noncardiac chest pain   Echo August 2022 IMPRESSIONS   1. Left ventricular ejection fraction, by estimation, is 50 to 55%. The left ventricle has low normal function. The left ventricle has no regional wall motion abnormalities. There is mild concentric left ventricular hypertrophy. Left ventricular  diastolic parameters are consistent with Grade I diastolic dysfunction (impaired relaxation).   2. Right ventricular systolic function is normal. The right ventricular size is normal. There is normal pulmonary artery systolic pressure.  3. The mitral valve is normal in structure. No evidence of mitral valve regurgitation. No evidence of mitral stenosis.   4. The aortic valve is tricuspid. Aortic valve regurgitation is not visualized. No aortic stenosis is present.   5. The inferior vena cava is normal in size with greater than 50%  respiratory variability, suggesting right atrial pressure of 3 mmHg.   FINDINGS   Left Ventricle: Left ventricular ejection fraction, by estimation, is 50  to 55%. The left ventricle has low normal function. The left ventricle has  no regional wall motion abnormalities. Global longitudinal strain  performed but not reported based on  interpreter judgement due to suboptimal tracking. The left ventricular  internal cavity size was normal in size. There is mild concentric left  ventricular hypertrophy. Left ventricular diastolic parameters are  consistent with Grade I  diastolic dysfunction   (impaired relaxation). Normal left ventricular filling pressure.   Right Ventricle: The right ventricular size is normal. No increase in  right ventricular wall thickness. Right ventricular systolic function is  normal. There is normal pulmonary artery systolic pressure. The tricuspid  regurgitant velocity is 2.00 m/s, and   with an assumed right atrial pressure of 3 mmHg, the estimated right  ventricular systolic pressure is 19.0 mmHg.   Left Atrium: Left atrial size was normal in size.   Right Atrium: Right atrial size was normal in size.   Pericardium: There is no evidence of pericardial effusion.   Mitral Valve: The mitral valve is normal in structure. No evidence of  mitral valve regurgitation. No evidence of mitral valve stenosis.   Tricuspid Valve: The tricuspid valve is normal in structure. Tricuspid  valve regurgitation is trivial. No evidence of tricuspid stenosis.   Aortic Valve: The aortic valve is tricuspid. Aortic valve regurgitation is  not visualized. No aortic stenosis is present.   Pulmonic Valve: The pulmonic valve was normal in structure. Pulmonic valve  regurgitation is not visualized. No evidence of pulmonic stenosis.   Aorta: The aortic root, ascending aorta, aortic arch and descending aorta  are all structurally normal, with no evidence of dilitation or  obstruction.   Venous: The pulmonary veins were not well visualized. The inferior vena  cava is normal in size with greater than 50% respiratory variability,  suggesting right atrial pressure of 3 mmHg.   IAS/Shunts: No atrial level shunt detected by color flow Doppler.    Nuclear stress test 2022 The left ventricular ejection fraction is normal (55-65%). Nuclear stress EF: 61%. There was no ST segment deviation noted during stress. The study is normal. This is a low risk study.  Recent Labs: 01/18/2022: BUN 12; Creatinine, Ser 0.83; Hemoglobin 15.9; Platelets 274; Potassium  4.3; Sodium 140  Recent Lipid Panel    Component Value Date/Time   CHOL 152 01/18/2022 0948   TRIG 128 01/18/2022 0948   HDL 46 01/18/2022 0948   CHOLHDL 3.3 01/18/2022 0948   CHOLHDL 3.8 04/16/2012 1050   VLDL 23 04/16/2012 1050   LDLCALC 83 01/18/2022 0948    Physical Exam:    VS:  BP 138/84   Pulse (!) 46   Ht 5\' 7"  (1.702 m)   Wt 253 lb 12.8 oz (115.1 kg)   SpO2 98%   BMI 39.75 kg/m     Wt Readings from Last 3 Encounters:  12/09/22 253 lb 12.8 oz (115.1 kg)  12/06/22 252 lb (114.3 kg)  11/16/22 252 lb (114.3 kg)     GEN: Well nourished, well developed  in no acute distress HEENT: Normal NECK: No JVD; No carotid bruits LYMPHATICS: No lymphadenopathy CARDIAC: S1S2 noted,RRR, no murmurs, rubs, gallops RESPIRATORY:  Clear to auscultation without rales, wheezing or rhonchi  ABDOMEN: Soft, non-tender, non-distended, +bowel sounds, no guarding. EXTREMITIES: No edema, No cyanosis, no clubbing MUSCULOSKELETAL:  No deformity  SKIN: Warm and dry NEUROLOGIC:  Alert and oriented x 3, non-focal PSYCHIATRIC:  Normal affect, good insight  ASSESSMENT:    1. Atypical chest pain   2. Mixed hyperlipidemia     PLAN:    Hyperlipidemia LDL improved but still elevated. -Increase Crestor to 10mg  daily.  Knee Pain Patient discussing knee surgery with orthopedic surgeon. -Cardiology clearance for surgery when needed.    The patient understands the need to lose weight with diet and exercise. We have discussed specific strategies for this.  The patient is in agreement with the above plan. The patient left the office in stable condition.  The patient will follow up in 9 month or sooner if needed.   Medication Adjustments/Labs and Tests Ordered: Current medicines are reviewed at length with the patient today.  Concerns regarding medicines are outlined above.  Orders Placed This Encounter  Procedures   EKG 12-Lead   Meds ordered this encounter  Medications   rosuvastatin  (CRESTOR) 10 MG tablet    Sig: Take 1 tablet (10 mg total) by mouth daily.    Dispense:  90 tablet    Refill:  3    Patient Instructions  Medication Instructions:  Increase Crestor to 10 mg *If you need a refill on your cardiac medications before your next appointment, please call your pharmacy*  Follow-Up: At [facility], you and your health needs are our priority.  As part of our continuing mission to provide you with exceptional heart care, we have created designated Provider Care Teams.  These Care Teams include your primary Cardiologist (physician) and Advanced Practice Providers (APPs -  Physician Assistants and Nurse Practitioners) who all work together to provide you with the care you need, when you need it.  We recommend signing up for the patient portal called "MyChart".  Sign up information is provided on this After Visit Summary.  MyChart is used to connect with patients for Virtual Visits (Telemedicine).  Patients are able to view lab/test results, encounter notes, upcoming appointments, etc.  Non-urgent messages can be sent to your provider as well.   To learn more about what you can do with MyChart, go to [email or web address].    Your next appointment:   9 month(s)  Provider:   Thomasene Ripple, DO      Adopting a Healthy Lifestyle.  Know what a healthy weight is for you (roughly BMI <25) and aim to maintain this   Aim for 7+ servings of fruits and vegetables daily   65-80+ fluid ounces of water or unsweet tea for healthy kidneys   Limit to max 1 drink of alcohol per day; avoid smoking/tobacco   Limit animal fats in diet for cholesterol and heart health - choose grass fed whenever available   Avoid highly processed foods, and foods high in saturated/trans fats   Aim for low stress - take time to unwind and care for your mental health   Aim for 150 min of moderate intensity exercise weekly for heart health, and weights twice weekly for bone health   Aim  for 7-9 hours of sleep daily   When it comes to diets, agreement about the perfect plan isnt easy to find,  even among the experts. Experts at the [facility] [employer] developed an idea known as the Healthy Eating Plate. Just imagine a plate divided into logical, healthy portions.   The emphasis is on diet quality:   Load up on vegetables and fruits - one-half of your plate: Aim for color and variety, and remember that potatoes dont count.   Go for whole grains - one-quarter of your plate: Whole wheat, barley, wheat berries, quinoa, oats, brown rice, and foods made with them. If you want pasta, go with whole wheat pasta.   Protein power - one-quarter of your plate: Fish, chicken, beans, and nuts are all healthy, versatile protein sources. Limit red meat.   The diet, however, does go beyond the plate, offering a few other suggestions.   Use healthy plant oils, such as olive, canola, soy, corn, sunflower and peanut. Check the labels, and avoid partially hydrogenated oil, which have unhealthy trans fats.   If youre thirsty, drink water. Coffee and tea are good in moderation, but skip sugary drinks and limit milk and dairy products to one or two daily servings.   The type of carbohydrate in the diet is more important than the amount. Some sources of carbohydrates, such as vegetables, fruits, whole grains, and beans-are healthier than others.   Finally, stay active  Signed, Thomasene Ripple, DO  12/10/2022 8:55 PM    [facility]

## 2022-12-13 DIAGNOSIS — M4316 Spondylolisthesis, lumbar region: Secondary | ICD-10-CM | POA: Diagnosis not present

## 2022-12-13 DIAGNOSIS — M549 Dorsalgia, unspecified: Secondary | ICD-10-CM | POA: Diagnosis not present

## 2022-12-16 DIAGNOSIS — Z6839 Body mass index (BMI) 39.0-39.9, adult: Secondary | ICD-10-CM | POA: Diagnosis not present

## 2022-12-16 DIAGNOSIS — R198 Other specified symptoms and signs involving the digestive system and abdomen: Secondary | ICD-10-CM | POA: Diagnosis not present

## 2022-12-16 DIAGNOSIS — R1031 Right lower quadrant pain: Secondary | ICD-10-CM | POA: Diagnosis not present

## 2022-12-21 DIAGNOSIS — Z1331 Encounter for screening for depression: Secondary | ICD-10-CM | POA: Diagnosis not present

## 2022-12-21 DIAGNOSIS — Z1339 Encounter for screening examination for other mental health and behavioral disorders: Secondary | ICD-10-CM | POA: Diagnosis not present

## 2022-12-21 DIAGNOSIS — Z139 Encounter for screening, unspecified: Secondary | ICD-10-CM | POA: Diagnosis not present

## 2022-12-21 DIAGNOSIS — Z1231 Encounter for screening mammogram for malignant neoplasm of breast: Secondary | ICD-10-CM | POA: Diagnosis not present

## 2022-12-21 DIAGNOSIS — Z6838 Body mass index (BMI) 38.0-38.9, adult: Secondary | ICD-10-CM | POA: Diagnosis not present

## 2022-12-21 DIAGNOSIS — E669 Obesity, unspecified: Secondary | ICD-10-CM | POA: Diagnosis not present

## 2022-12-21 DIAGNOSIS — S39011A Strain of muscle, fascia and tendon of abdomen, initial encounter: Secondary | ICD-10-CM | POA: Diagnosis not present

## 2022-12-21 DIAGNOSIS — Z136 Encounter for screening for cardiovascular disorders: Secondary | ICD-10-CM | POA: Diagnosis not present

## 2022-12-21 DIAGNOSIS — Z0000 Encounter for general adult medical examination without abnormal findings: Secondary | ICD-10-CM | POA: Diagnosis not present

## 2022-12-24 DIAGNOSIS — M5417 Radiculopathy, lumbosacral region: Secondary | ICD-10-CM | POA: Diagnosis not present

## 2022-12-24 DIAGNOSIS — M25551 Pain in right hip: Secondary | ICD-10-CM | POA: Diagnosis not present

## 2022-12-24 DIAGNOSIS — M47816 Spondylosis without myelopathy or radiculopathy, lumbar region: Secondary | ICD-10-CM | POA: Diagnosis not present

## 2022-12-24 DIAGNOSIS — Z792 Long term (current) use of antibiotics: Secondary | ICD-10-CM | POA: Diagnosis not present

## 2022-12-24 DIAGNOSIS — Z79899 Other long term (current) drug therapy: Secondary | ICD-10-CM | POA: Diagnosis not present

## 2022-12-24 DIAGNOSIS — M4316 Spondylolisthesis, lumbar region: Secondary | ICD-10-CM | POA: Diagnosis not present

## 2023-02-14 ENCOUNTER — Other Ambulatory Visit: Payer: Self-pay | Admitting: Internal Medicine

## 2023-03-27 ENCOUNTER — Other Ambulatory Visit: Payer: Self-pay | Admitting: Cardiology

## 2023-05-18 ENCOUNTER — Other Ambulatory Visit: Payer: Self-pay | Admitting: Internal Medicine

## 2023-05-24 DIAGNOSIS — E785 Hyperlipidemia, unspecified: Secondary | ICD-10-CM | POA: Diagnosis not present

## 2023-05-31 DIAGNOSIS — Z6839 Body mass index (BMI) 39.0-39.9, adult: Secondary | ICD-10-CM | POA: Diagnosis not present

## 2023-05-31 DIAGNOSIS — I10 Essential (primary) hypertension: Secondary | ICD-10-CM | POA: Diagnosis not present

## 2023-05-31 DIAGNOSIS — E785 Hyperlipidemia, unspecified: Secondary | ICD-10-CM | POA: Diagnosis not present

## 2023-05-31 DIAGNOSIS — Z1231 Encounter for screening mammogram for malignant neoplasm of breast: Secondary | ICD-10-CM | POA: Diagnosis not present

## 2023-07-25 ENCOUNTER — Other Ambulatory Visit: Payer: Self-pay | Admitting: Family Medicine

## 2023-07-25 DIAGNOSIS — Z1231 Encounter for screening mammogram for malignant neoplasm of breast: Secondary | ICD-10-CM

## 2023-07-27 ENCOUNTER — Ambulatory Visit
Admission: RE | Admit: 2023-07-27 | Discharge: 2023-07-27 | Disposition: A | Discharge status: Home / Self Care | Source: Ambulatory Visit | Attending: Family Medicine | Admitting: Family Medicine

## 2023-07-27 DIAGNOSIS — Z1231 Encounter for screening mammogram for malignant neoplasm of breast: Secondary | ICD-10-CM

## 2023-07-27 DIAGNOSIS — Z23 Encounter for immunization: Secondary | ICD-10-CM | POA: Diagnosis not present

## 2023-08-18 ENCOUNTER — Other Ambulatory Visit: Payer: Self-pay | Admitting: Internal Medicine

## 2023-11-04 ENCOUNTER — Ambulatory Visit

## 2023-11-14 ENCOUNTER — Other Ambulatory Visit: Payer: Self-pay | Admitting: Internal Medicine

## 2023-11-15 ENCOUNTER — Ambulatory Visit (INDEPENDENT_AMBULATORY_CARE_PROVIDER_SITE_OTHER)

## 2023-11-15 VITALS — BP 110/82 | HR 60 | Temp 97.5°F | Ht 67.0 in | Wt 251.0 lb

## 2023-11-15 DIAGNOSIS — Z6839 Body mass index (BMI) 39.0-39.9, adult: Secondary | ICD-10-CM | POA: Insufficient documentation

## 2023-11-15 DIAGNOSIS — I10 Essential (primary) hypertension: Secondary | ICD-10-CM | POA: Diagnosis not present

## 2023-11-15 DIAGNOSIS — R7303 Prediabetes: Secondary | ICD-10-CM

## 2023-11-15 DIAGNOSIS — Z23 Encounter for immunization: Secondary | ICD-10-CM

## 2023-11-15 DIAGNOSIS — M25562 Pain in left knee: Secondary | ICD-10-CM

## 2023-11-15 DIAGNOSIS — G8929 Other chronic pain: Secondary | ICD-10-CM | POA: Diagnosis not present

## 2023-11-15 DIAGNOSIS — E66812 Obesity, class 2: Secondary | ICD-10-CM

## 2023-11-15 DIAGNOSIS — Z6838 Body mass index (BMI) 38.0-38.9, adult: Secondary | ICD-10-CM | POA: Insufficient documentation

## 2023-11-15 DIAGNOSIS — Z1159 Encounter for screening for other viral diseases: Secondary | ICD-10-CM | POA: Diagnosis not present

## 2023-11-15 DIAGNOSIS — Z114 Encounter for screening for human immunodeficiency virus [HIV]: Secondary | ICD-10-CM | POA: Diagnosis not present

## 2023-11-15 DIAGNOSIS — E782 Mixed hyperlipidemia: Secondary | ICD-10-CM

## 2023-11-15 DIAGNOSIS — M25561 Pain in right knee: Secondary | ICD-10-CM

## 2023-11-15 NOTE — Assessment & Plan Note (Signed)
 Blood pressure is well-controlled with current medication regimen. Carvedilol  6.25 mg twice daily is effective in managing both blood pressure and heart rate.

## 2023-11-15 NOTE — Patient Instructions (Signed)
  VISIT SUMMARY: Today, you had a new patient visit to review your medications and discuss several health concerns, including knee pain, back pain, hypertension, hyperlipidemia, prediabetes, and gastrointestinal issues.  YOUR PLAN: CHRONIC BILATERAL KNEE PAIN: You have ongoing knee pain, especially after your left meniscus repair in October 2022. This pain affects your daily activities. -Exercise and weight management are recommended to help alleviate your symptoms. -Engage in walking and stretching exercises to strengthen the muscles around your knees.  CHRONIC BACK PAIN: You have chronic back pain with vertebral issues and prefer conservative management over surgery. -Exercise and weight management are recommended to help alleviate your symptoms. -Engage in walking and stretching exercises to strengthen the muscles around your back.  OBESITY: Your BMI is 39, and you have lost 2.5 pounds since last year. You are making lifestyle changes to improve your health. -Reduce soda intake and choose healthier snacks. -Start a walking regimen on a treadmill, beginning with 10 minutes three times daily and gradually increasing the duration. -Blood work will be ordered to assess your overall health status.  ESSENTIAL HYPERTENSION: Your blood pressure is well-controlled with your current medication, carvedilol  6.25 mg twice daily. -Continue taking carvedilol  6.25 mg twice daily as prescribed.  MIXED HYPERLIPIDEMIA: Your cholesterol levels are managed with rosuvastatin  10 mg daily, and you are compliant with your medication. -Continue taking rosuvastatin  10 mg daily as prescribed.  PREDIABETES: You are making dietary changes to manage your prediabetes and prevent progression to diabetes. -Continue with lifestyle modifications to prevent progression to diabetes. -Blood work will be ordered to assess the current status of your prediabetes.  ADENOMATOUS COLON POLYPS: You have a history of adenomatous  colon polyps, with your last colonoscopy performed last year. -Your next colonoscopy is recommended in five years.                      Contains text generated by [employer]bridge.                                 Contains text generated by [employer]bridge.

## 2023-11-15 NOTE — Assessment & Plan Note (Signed)
 Obesity with a BMI of 39 with comorbidities of HTN and hyperlipidemia.  She has lost 2.5 pounds since last year. Lifestyle modifications are being implemented, including dietary changes and plans to increase physical activity. - Encourage reduction of soda intake and healthier snacking options. - Advise starting a walking regimen on a treadmill, beginning with 10 minutes three times daily and gradually increasing duration. - Order blood work to assess overall health status.

## 2023-11-15 NOTE — Assessment & Plan Note (Signed)
 Mixed hyperlipidemia is managed with rosuvastatin  10 mg daily. She is compliant with her medication regimen.

## 2023-11-15 NOTE — Assessment & Plan Note (Signed)
  Chronic bilateral knee pain Chronic bilateral knee pain with a history of left meniscus repair in October 2022. Pain persists, and knee replacement may be necessary in the future. - Recommend exercise and weight management to alleviate symptoms. - Encourage walking and stretching exercises to strengthen muscles around the knees.

## 2023-11-15 NOTE — Progress Notes (Signed)
 Subjective:  Patient ID: Autumn [name], female    DOB: [birth year]  Age: 58 y.o. MRN: [id number]  Chief Complaint  Patient presents with   Establish Care    HPI:  Patient is establishing as a new patient.  Discussed the use of AI scribe software for clinical note transcription with the patient, who gave verbal consent to proceed.  Discussed the use of AI scribe software for clinical note transcription with the patient, who gave verbal consent to proceed.  History of Present Illness   Autumn [name] is a 58 year old female who presents for a new patient visit and medication review.  Knee pain - Worsening knee pain, particularly after left meniscus repair in October 2022 - Pain contributes to disability status - Uses Tylenol  and diclofenac gel as needed for pain management  Back pain - Chronic back pain with vertebral issues - Declined surgical intervention despite recommendations  Hypertension and heart rate management - Takes carvedilol  6.25 mg twice daily for blood pressure and heart rate control - No chest pain, shortness of breath, or palpitations - Left heart catheterization and coronary angiography in December 2023 showed no blockages  Hyperlipidemia - Takes rosuvastatin  10 mg daily for cholesterol management  Prediabetes - Making dietary changes, including reducing bread and soda intake, to improve glycemic control  Gastrointestinal symptoms and history - History of adenomatous colon polyps; last colonoscopy performed in 2024, with repeat recommended in five years - History of diverticulitis with two prior hospitalizations, no recent severe episodes - Uses Pepcid  as needed for gastrointestinal discomfort  Physical activity and lifestyle modification - Attempting to increase physical activity with plans to use a treadmill             11/15/2023   10:57 AM  Depression screen PHQ 2/9  Decreased Interest 0  Down, Depressed, Hopeless 0  PHQ - 2 Score 0          11/19/2020   10:23 AM 01/25/2022    6:11 AM 11/15/2023   10:57 AM  Fall Risk  Falls in the past year?   0  Was there an injury with Fall?   0  Fall Risk Category Calculator   0  (RETIRED) Patient Fall Risk Level Moderate fall risk  High fall risk    Patient at Risk for Falls Due to   No Fall Risks  Fall risk Follow up   Falls evaluation completed     Data saved with a previous flowsheet row definition     Current Outpatient Medications on File Prior to Visit  Medication Sig Dispense Refill   acetaminophen  (TYLENOL ) 325 MG tablet Take 650 mg by mouth every 6 (six) hours as needed for mild pain.     carvedilol  (COREG ) 6.25 MG tablet Take 1 tablet by mouth twice daily 180 tablet 3   diclofenac Sodium (VOLTAREN) 1 % GEL Apply 2 g topically 4 (four) times daily as needed for pain.     famotidine  (PEPCID ) 20 MG tablet TAKE 1 TABLET BY MOUTH ONCE DAILY AT BEDTIME 90 tablet 0   rosuvastatin  (CRESTOR ) 10 MG tablet Take 1 tablet (10 mg total) by mouth daily. 90 tablet 3   No current facility-administered medications on file prior to visit.  . Social History   Socioeconomic History   Marital status: Married    Spouse name: Ubaldo   Number of children: 1   Years of education: Not on file   Highest education level: 12th grade  Occupational History  Occupation: ASSOCIATE    Employer: [id number]  Tobacco Use   Smoking status: Never   Smokeless tobacco: Never  Vaping Use   Vaping status: Never Used  Substance and Sexual Activity   Alcohol  use: Never   Drug use: Never   Sexual activity: Yes    Partners: Male    Birth control/protection: None  Other Topics Concern   Not on file  Social History Narrative   Not on file   Social Drivers of Health   Financial Resource Strain: Low Risk  (11/14/2023)   Overall Financial Resource Strain (CARDIA)    Difficulty of Paying Living Expenses: Not very hard  Food Insecurity: No Food Insecurity (11/14/2023)   Hunger Vital Sign    Worried  About Running Out of Food in the Last Year: Never true    Ran Out of Food in the Last Year: Never true  Transportation Needs: No Transportation Needs (11/14/2023)   PRAPARE - Administrator, Civil Service (Medical): No    Lack of Transportation (Non-Medical): No  Physical Activity: Inactive (11/14/2023)   Exercise Vital Sign    Days of Exercise per Week: 0 days    Minutes of Exercise per Session: Not on file  Stress: No Stress Concern Present (11/14/2023)   [employer] of Occupational Health - Occupational Stress Questionnaire    Feeling of Stress: Not at all  Social Connections: Moderately Integrated (11/14/2023)   Social Connection and Isolation Panel    Frequency of Communication with Friends and Family: More than three times a week    Frequency of Social Gatherings with Friends and Family: Twice a week    Attends Religious Services: More than 4 times per year    Active Member of [employer] or Organizations: No    Attends Engineer, structural: Not on file    Marital Status: Married   Past Medical History:  Diagnosis Date   Abdominal pain, acute, epigastric 04/16/2012   Acute medial meniscus tear of left knee 11/06/2020   Arthritis    Chest pain on exertion 04/16/2012   Diverticulitis    last episode the sat before thanksgiving   Diverticulosis    Gallstones    History of kidney stones    Hx of adenomatous colonic polyps 06/23/2012   March 2012 - ascending colon polyp 1.2 cm = tubular adenoma (repeat March 2015)   Hypertension    Lower abdominal pain 08/25/2020   Morbid obesity with BMI of 40.0-44.9, adult (HCC) 04/16/2012   Osteoarthritis    Pre-diabetes    Family History  Problem Relation Age of Onset   Hypertension Mother    Diabetes Mother    Lymphoma Mother    Thyroid  disease Mother    Brain cancer Mother    Cancer Mother    Lung cancer Father    Cancer Father    Heart murmur Brother    Pancreatic cancer Paternal Aunt    Cancer Paternal  Aunt        unknown type   Stomach cancer Paternal Uncle    Dementia Maternal Grandfather    Colon cancer Neg Hx    Rectal cancer Neg Hx     Review of Systems  Constitutional:  Negative for chills, fatigue and fever.  HENT:  Negative for congestion, ear pain, sinus pressure and sore throat.   Respiratory:  Negative for cough and shortness of breath.   Cardiovascular:  Negative for chest pain.  Gastrointestinal:  Negative for abdominal pain, constipation, diarrhea,  nausea and vomiting.  Genitourinary:  Negative for dysuria and frequency.  Musculoskeletal:  Positive for arthralgias and back pain. Negative for myalgias.  Neurological:  Negative for dizziness and headaches.  Psychiatric/Behavioral:  Negative for dysphoric mood. The patient is not nervous/anxious.      Objective:  BP 110/82   Pulse 60   Temp (!) 97.5 F (36.4 C)   Ht 5' 7 (1.702 m)   Wt 251 lb (113.9 kg)   SpO2 97%   BMI 39.31 kg/m      11/15/2023   10:52 AM 12/09/2022   10:34 AM 12/06/2022   11:35 AM  BP/Weight  Systolic BP 110 138 123  Diastolic BP 82 84 66  Wt. (Lbs) 251 253.8   BMI 39.31 kg/m2 39.75 kg/m2     Physical Exam Vitals and nursing note reviewed.  Constitutional:      Appearance: She is obese.  HENT:     Head: Normocephalic and atraumatic.     Mouth/Throat:     Mouth: Mucous membranes are moist.  Cardiovascular:     Rate and Rhythm: Normal rate and regular rhythm.  Pulmonary:     Effort: Pulmonary effort is normal.     Breath sounds: Normal breath sounds.  Musculoskeletal:     Cervical back: Normal range of motion.  Skin:    General: Skin is warm.  Neurological:     General: No focal deficit present.     Mental Status: She is alert and oriented to person, place, and time.  Psychiatric:        Mood and Affect: Mood normal.        Lab Results  Component Value Date   WBC 7.5 01/18/2022   HGB 15.9 01/18/2022   HCT 49.8 (H) 01/18/2022   PLT 274 01/18/2022   GLUCOSE 107  (H) 01/18/2022   CHOL 152 01/18/2022   TRIG 128 01/18/2022   HDL 46 01/18/2022   LDLCALC 83 01/18/2022   ALT 28 06/20/2012   AST 30 06/20/2012   NA 140 01/18/2022   K 4.3 01/18/2022   CL 103 01/18/2022   CREATININE 0.83 01/18/2022   BUN 12 01/18/2022   CO2 24 01/18/2022   TSH 3.307 05/29/2016   HGBA1C 6.1 (H) 01/18/2022      Assessment & Plan:  Primary hypertension Assessment & Plan: Blood pressure is well-controlled with current medication regimen. Carvedilol  6.25 mg twice daily is effective in managing both blood pressure and heart rate.  Orders: -     CBC with Differential/Platelet -     Comprehensive metabolic panel with GFR  Prediabetes Assessment & Plan: Prediabetes is being monitored. No current diagnosis of diabetes. - Encourage lifestyle modifications to prevent progression to diabetes. - Order blood work to assess current status of prediabetes.  Orders: -     Hemoglobin A1c  Mixed hyperlipidemia Assessment & Plan: Mixed hyperlipidemia is managed with rosuvastatin  10 mg daily. She is compliant with her medication regimen.  Orders: -     Lipid panel  Need for hepatitis C screening test -     Hepatitis C antibody  Encounter for immunization -     Flu vaccine, recombinant, trivalent, inj  Screening for HIV without presence of risk factors -     HIV Antibody (routine testing w rflx)  Chronic pain of both knees Assessment & Plan:    Chronic bilateral knee pain Chronic bilateral knee pain with a history of left meniscus repair in October 2022. Pain persists, and knee  replacement may be necessary in the future. - Recommend exercise and weight management to alleviate symptoms. - Encourage walking and stretching exercises to strengthen muscles around the knees.   Class 2 severe obesity due to excess calories with serious comorbidity and body mass index (BMI) of 39.0 to 39.9 in adult Assessment & Plan: Obesity with a BMI of 39 with comorbidities of HTN  and hyperlipidemia.  She has lost 2.5 pounds since last year. Lifestyle modifications are being implemented, including dietary changes and plans to increase physical activity. - Encourage reduction of soda intake and healthier snacking options. - Advise starting a walking regimen on a treadmill, beginning with 10 minutes three times daily and gradually increasing duration. - Order blood work to assess overall health status.     Chronic back pain Chronic back pain with vertebral issues. She has declined surgical intervention and prefers conservative management. - Recommend exercise and weight management to alleviate symptoms. - Encourage walking and stretching exercises to strengthen muscles around the back.  Adenomatous colon polyps History of adenomatous colon polyps with last colonoscopy performed last year. - Recommend next colonoscopy in five years.        No orders of the defined types were placed in this encounter.  Orders Placed This Encounter  Procedures   Flu vaccine, recombinant, trivalent, inj   CBC with Differential   Comprehensive metabolic panel with GFR   Lipid Panel   Hemoglobin A1c   Hepatitis C antibody   HIV Antibody (routine testing w rflx)     Assessment and Plan    .   Follow-up: Return in about 3 months (around 02/14/2024) for chronic disease follow up.  AVS was given to patient prior to departure.  Tommy Schimke [facility] [phone]

## 2023-11-15 NOTE — Assessment & Plan Note (Signed)
 Prediabetes is being monitored. No current diagnosis of diabetes. - Encourage lifestyle modifications to prevent progression to diabetes. - Order blood work to assess current status of prediabetes.

## 2023-11-16 LAB — CBC WITH DIFFERENTIAL/PLATELET
Basophils Absolute: 0.1 x10E3/uL (ref 0.0–0.2)
Basos: 1 %
EOS (ABSOLUTE): 0.3 x10E3/uL (ref 0.0–0.4)
Eos: 4 %
Hematocrit: 50.1 % — ABNORMAL HIGH (ref 34.0–46.6)
Hemoglobin: 15.8 g/dL (ref 11.1–15.9)
Immature Grans (Abs): 0 x10E3/uL (ref 0.0–0.1)
Immature Granulocytes: 0 %
Lymphocytes Absolute: 3 x10E3/uL (ref 0.7–3.1)
Lymphs: 38 %
MCH: 28.5 pg (ref 26.6–33.0)
MCHC: 31.5 g/dL (ref 31.5–35.7)
MCV: 90 fL (ref 79–97)
Monocytes Absolute: 0.6 x10E3/uL (ref 0.1–0.9)
Monocytes: 7 %
Neutrophils Absolute: 3.9 x10E3/uL (ref 1.4–7.0)
Neutrophils: 50 %
Platelets: 267 x10E3/uL (ref 150–450)
RBC: 5.54 x10E6/uL — ABNORMAL HIGH (ref 3.77–5.28)
RDW: 12.9 % (ref 11.7–15.4)
WBC: 7.8 x10E3/uL (ref 3.4–10.8)

## 2023-11-16 LAB — HEMOGLOBIN A1C
Est. average glucose Bld gHb Est-mCnc: 128 mg/dL
Hgb A1c MFr Bld: 6.1 % — ABNORMAL HIGH (ref 4.8–5.6)

## 2023-11-16 LAB — LIPID PANEL
Chol/HDL Ratio: 3.3 ratio (ref 0.0–4.4)
Cholesterol, Total: 139 mg/dL (ref 100–199)
HDL: 42 mg/dL (ref >39)
LDL Chol Calc (NIH): 65 mg/dL (ref 0–99)
Triglycerides: 190 mg/dL — ABNORMAL HIGH (ref 0–149)
VLDL Cholesterol Cal: 32 mg/dL (ref 5–40)

## 2023-11-16 LAB — COMPREHENSIVE METABOLIC PANEL WITH GFR
ALT: 25 IU/L (ref 0–32)
AST: 27 IU/L (ref 0–40)
Albumin: 4 g/dL (ref 3.8–4.9)
Alkaline Phosphatase: 95 IU/L (ref 49–135)
BUN/Creatinine Ratio: 11 (ref 9–23)
BUN: 9 mg/dL (ref 6–24)
Bilirubin Total: 0.4 mg/dL (ref 0.0–1.2)
CO2: 21 mmol/L (ref 20–29)
Calcium: 9.7 mg/dL (ref 8.7–10.2)
Chloride: 106 mmol/L (ref 96–106)
Creatinine, Ser: 0.85 mg/dL (ref 0.57–1.00)
Globulin, Total: 3 g/dL (ref 1.5–4.5)
Glucose: 95 mg/dL (ref 70–99)
Potassium: 4.8 mmol/L (ref 3.5–5.2)
Sodium: 142 mmol/L (ref 134–144)
Total Protein: 7 g/dL (ref 6.0–8.5)
eGFR: 79 mL/min/1.73 (ref >59)

## 2023-11-16 LAB — HEPATITIS C ANTIBODY: Hep C Virus Ab: NONREACTIVE

## 2023-11-16 LAB — HIV ANTIBODY (ROUTINE TESTING W REFLEX): HIV Screen 4th Generation wRfx: NONREACTIVE

## 2023-11-18 ENCOUNTER — Ambulatory Visit: Payer: Self-pay

## 2023-12-03 ENCOUNTER — Other Ambulatory Visit: Payer: Self-pay | Admitting: Cardiology

## 2024-01-17 ENCOUNTER — Ambulatory Visit: Admitting: Cardiology

## 2024-02-15 ENCOUNTER — Other Ambulatory Visit: Payer: Self-pay | Admitting: Internal Medicine

## 2024-02-20 ENCOUNTER — Ambulatory Visit (INDEPENDENT_AMBULATORY_CARE_PROVIDER_SITE_OTHER)

## 2024-02-20 VITALS — BP 136/84 | HR 88 | Temp 98.0°F | Ht 67.0 in | Wt 247.0 lb

## 2024-02-20 DIAGNOSIS — R7303 Prediabetes: Secondary | ICD-10-CM | POA: Diagnosis not present

## 2024-02-20 DIAGNOSIS — Z6838 Body mass index (BMI) 38.0-38.9, adult: Secondary | ICD-10-CM | POA: Diagnosis not present

## 2024-02-20 DIAGNOSIS — E66812 Obesity, class 2: Secondary | ICD-10-CM

## 2024-02-20 DIAGNOSIS — I10 Essential (primary) hypertension: Secondary | ICD-10-CM | POA: Diagnosis not present

## 2024-02-20 DIAGNOSIS — E782 Mixed hyperlipidemia: Secondary | ICD-10-CM

## 2024-02-20 NOTE — Patient Instructions (Signed)
" °  VISIT SUMMARY: Today, we discussed your intermittent right lower abdominal pain, ongoing musculoskeletal pain, weight management efforts, and metabolic findings. We also reviewed your current medications and preventive care measures.  YOUR PLAN: CLASS 2 OBESITY: Your BMI has decreased from class 3 to class 2 obesity, and you have lost 4-5 pounds since September through dietary changes and increased physical activity. -Continue dietary modifications including reducing soda and bread intake, and increasing vegetable consumption. -Incorporate protein into your diet to help with feeling full. -Engage in regular physical activity, including walking and strength training with light weights. -Consider weight loss medications if lifestyle modifications are insufficient.  CHRONIC LOW BACK PAIN: You have ongoing low back pain with variable intensity. -Continue using Voltaren gel and Tylenol  as needed for pain management.  CHRONIC BILATERAL KNEE PAIN: You have ongoing knee pain that limits your physical activity. -Continue using Voltaren gel and Tylenol  as needed for pain management.  PRIMARY HYPERTENSION: Your blood pressure is well controlled with your current medication regimen. -Continue your current antihypertensive medication regimen.  MIXED HYPERLIPIDEMIA: Your triglycerides were slightly elevated in previous blood work. -Continue taking Crestor  10 mg daily.  PREDIABETES: Your blood sugar levels are consistent with borderline diabetes (HbA1c 6.1). -Continue dietary modifications and weight loss efforts to manage your blood sugar levels.  GENERAL HEALTH MAINTENANCE: You are up to date with your mammograms and are due for a Medicare wellness visit. -Schedule your Medicare wellness visit, either in person or via telephone. -Continue regular health screenings as scheduled.                      Contains text generated by [employer]bridge.                                  Contains text generated by [employer]bridge.   "

## 2024-02-20 NOTE — Progress Notes (Signed)
 "  Subjective:  Patient ID: Autumn [name], female    DOB: 05-Jun-1965  Age: 59 y.o. MRN: [id number]  Chief Complaint  Patient presents with   Medical Management of Chronic Issues    HPI: Discussed the use of AI scribe software for clinical note transcription with the patient, who gave verbal consent to proceed.  History of Present Illness   Autumn [name] is a 59 year old female who presents for follow up appointment .  Doing well overall.   Right lower abdominal pain - Intermittent pain localized to the right lower abdomen, radiating downward - Pain occurs even when lying in bed - Severe episode the night before last, resolved spontaneously without intervention - No exacerbation with lying on the affected side or with physical activity such as walking uphill  Musculoskeletal pain - Knee and back pain with variable intensity, some days worse than others - Knee pain limits ambulation, but she attempts to walk when possible - Uses Voltaren gel and Tylenol  as needed for pain management  Weight management - Actively attempting weight loss - Lost 4-5 pounds since last visit in September - Dietary modifications include reduced soda and bread intake, increased vegetable consumption  Metabolic findings - Blood sugar level of 6.1 - Slightly elevated triglycerides  Additional symptoms and preventive care - No leg swelling - Normal bowel movements - Up to date with mammograms, next due in June or July            11/15/2023   10:57 AM  Depression screen PHQ 2/9  Decreased Interest 0  Down, Depressed, Hopeless 0  PHQ - 2 Score 0        11/15/2023   10:57 AM  Fall Risk   Falls in the past year? 0  Number falls in past yr: 0  Injury with Fall? 0   Risk for fall due to : No Fall Risks  Follow up Falls evaluation completed     Data saved with a previous flowsheet row definition    Patient Care Team: Devontre Siedschlag, MD as PCP - General (Family Medicine) Tobb, Kardie, DO as  PCP - Cardiology (Cardiology)   Review of Systems  Constitutional: Negative.   HENT: Negative.    Eyes: Negative.   Respiratory: Negative.    Cardiovascular: Negative.   Gastrointestinal:  Positive for abdominal pain (intermittent).  Musculoskeletal:  Positive for arthralgias and back pain.  Neurological: Negative.   Psychiatric/Behavioral: Negative.      Medications Ordered Prior to Encounter[1] Past Medical History:  Diagnosis Date   Abdominal pain, acute, epigastric 04/16/2012   Acute medial meniscus tear of left knee 11/06/2020   Arthritis    Chest pain on exertion 04/16/2012   Diverticulitis    last episode the sat before thanksgiving   Diverticulosis    Gallstones    History of kidney stones    Hx of adenomatous colonic polyps 06/23/2012   March 2012 - ascending colon polyp 1.2 cm = tubular adenoma (repeat March 2015)   Hypertension    Lower abdominal pain 08/25/2020   Morbid obesity with BMI of 40.0-44.9, adult (HCC) 04/16/2012   Osteoarthritis    Pre-diabetes    Past Surgical History:  Procedure Laterality Date   ABDOMINAL HYSTERECTOMY     bladder tack  02/15/2005   CHOLECYSTECTOMY     KNEE ARTHROSCOPY WITH MENISCAL REPAIR Left 11/19/2020   Procedure: LEFT KNEE ARTHROSCOPY PARTIAL MEDIAL MENISCECTOMY;  Surgeon: Jerri Kay HERO, MD;  Location:  SUR[facility]ERY CENTER;  Service: Orthopedics;  Laterality: Left;   LAPAROSCOPIC TOTAL HYSTERECTOMY  2006   LEFT HEART CATH AND CORONARY ANGIOGRAPHY N/A 01/25/2022   Procedure: LEFT HEART CATH AND CORONARY ANGIOGRAPHY;  Surgeon: Wonda Sharper, MD;  Location: [facility] INVASIVE CV LAB;  Service: Cardiovascular;  Laterality: N/A;    Family History  Problem Relation Age of Onset   Hypertension Mother    Diabetes Mother    Lymphoma Mother    Thyroid  disease Mother    Brain cancer Mother    Cancer Mother    Lung cancer Father    Cancer Father    Heart murmur Brother    Pancreatic cancer Paternal Aunt    Cancer Paternal  Aunt        unknown type   Stomach cancer Paternal Uncle    Dementia Maternal Grandfather    Colon cancer Neg Hx    Rectal cancer Neg Hx    Social History   Socioeconomic History   Marital status: Married    Spouse name: Ubaldo   Number of children: 1   Years of education: Not on file   Highest education level: 12th grade  Occupational History   Occupation: ASSOCIATE    Employer: [employer]  Tobacco Use   Smoking status: Never   Smokeless tobacco: Never  Vaping Use   Vaping status: Never Used  Substance and Sexual Activity   Alcohol  use: Never   Drug use: Never   Sexual activity: Yes    Partners: Male    Birth control/protection: None  Other Topics Concern   Not on file  Social History Narrative   Not on file   Social Drivers of Health   Tobacco Use: Low Risk (02/20/2024)   Patient History    Smoking Tobacco Use: Never    Smokeless Tobacco Use: Never    Passive Exposure: Not on file  Financial Resource Strain: Low Risk (11/14/2023)   Overall Financial Resource Strain (CARDIA)    Difficulty of Paying Living Expenses: Not very hard  Food Insecurity: No Food Insecurity (11/14/2023)   Epic    Worried About Radiation Protection Practitioner of Food in the Last Year: Never true    Ran Out of Food in the Last Year: Never true  Transportation Needs: No Transportation Needs (11/14/2023)   Epic    Lack of Transportation (Medical): No    Lack of Transportation (Non-Medical): No  Physical Activity: Inactive (11/14/2023)   Exercise Vital Sign    Days of Exercise per Week: 0 days    Minutes of Exercise per Session: Not on file  Stress: No Stress Concern Present (11/14/2023)   [employer] of Occupational Health - Occupational Stress Questionnaire    Feeling of Stress: Not at all  Social Connections: Moderately Integrated (11/14/2023)   Social Connection and Isolation Panel    Frequency of Communication with Friends and Family: More than three times a week    Frequency of Social Gatherings with  Friends and Family: Twice a week    Attends Religious Services: More than 4 times per year    Active Member of Clubs or Organizations: No    Attends Banker Meetings: Not on file    Marital Status: Married  Depression (PHQ2-9): Low Risk (11/15/2023)   Depression (PHQ2-9)    PHQ-2 Score: 0  Alcohol  Screen: Low Risk (11/15/2023)   Alcohol  Screen    Last Alcohol  Screening Score (AUDIT): 0  Housing: Low Risk (11/14/2023)   Epic    Unable to Pay for Housing  in the Last Year: No    Number of Times Moved in the Last Year: 0    Homeless in the Last Year: No  Utilities: Not At Risk (11/15/2023)   Epic    Threatened with loss of utilities: No  Health Literacy: Adequate Health Literacy (11/15/2023)   B1300 Health Literacy    Frequency of need for help with medical instructions: Never    Objective:  BP 136/84   Pulse 88   Temp 98 F (36.7 C)   Ht 5' 7 (1.702 m)   Wt 247 lb (112 kg)   SpO2 96%   BMI 38.69 kg/m      02/20/2024   10:39 AM 11/15/2023   10:52 AM 12/09/2022   10:34 AM  BP/Weight  Systolic BP 136 110 138  Diastolic BP 84 82 84  Wt. (Lbs) 247 251 253.8  BMI 38.69 kg/m2 39.31 kg/m2 39.75 kg/m2    Physical Exam Vitals and nursing note reviewed.  Constitutional:      Appearance: She is obese.  HENT:     Head: Normocephalic and atraumatic.  Cardiovascular:     Rate and Rhythm: Normal rate and regular rhythm.  Pulmonary:     Effort: Pulmonary effort is normal.     Breath sounds: Normal breath sounds.  Abdominal:     Comments: No abdominal tenderness on palpation  Musculoskeletal:        General: Normal range of motion.     Cervical back: Normal range of motion.  Skin:    General: Skin is warm.  Neurological:     General: No focal deficit present.     Mental Status: She is alert.  Psychiatric:        Mood and Affect: Mood normal.         Lab Results  Component Value Date   WBC 7.8 11/15/2023   HGB 15.8 11/15/2023   HCT 50.1 (H) 11/15/2023    PLT 267 11/15/2023   GLUCOSE 95 11/15/2023   CHOL 139 11/15/2023   TRIG 190 (H) 11/15/2023   HDL 42 11/15/2023   LDLCALC 65 11/15/2023   ALT 25 11/15/2023   AST 27 11/15/2023   NA 142 11/15/2023   K 4.8 11/15/2023   CL 106 11/15/2023   CREATININE 0.85 11/15/2023   BUN 9 11/15/2023   CO2 21 11/15/2023   TSH 3.307 05/29/2016   HGBA1C 6.1 (H) 11/15/2023    Results for orders placed or performed in visit on 11/15/23  CBC with Differential   Collection Time: 11/15/23 11:30 AM  Result Value Ref Range   WBC 7.8 3.4 - 10.8 x10E3/uL   RBC 5.54 (H) 3.77 - 5.28 x10E6/uL   Hemoglobin 15.8 11.1 - 15.9 g/dL   Hematocrit 49.8 (H) 65.9 - 46.6 %   MCV 90 79 - 97 fL   MCH 28.5 26.6 - 33.0 pg   MCHC 31.5 31.5 - 35.7 g/dL   RDW 87.0 88.2 - 84.5 %   Platelets 267 150 - 450 x10E3/uL   Neutrophils 50 Not Estab. %   Lymphs 38 Not Estab. %   Monocytes 7 Not Estab. %   Eos 4 Not Estab. %   Basos 1 Not Estab. %   Neutrophils Absolute 3.9 1.4 - 7.0 x10E3/uL   Lymphocytes Absolute 3.0 0.7 - 3.1 x10E3/uL   Monocytes Absolute 0.6 0.1 - 0.9 x10E3/uL   EOS (ABSOLUTE) 0.3 0.0 - 0.4 x10E3/uL   Basophils Absolute 0.1 0.0 - 0.2 x10E3/uL   Immature  Granulocytes 0 Not Estab. %   Immature Grans (Abs) 0.0 0.0 - 0.1 x10E3/uL  Comprehensive metabolic panel with GFR   Collection Time: 11/15/23 11:30 AM  Result Value Ref Range   Glucose 95 70 - 99 mg/dL   BUN 9 6 - 24 mg/dL   Creatinine, Ser 9.14 0.57 - 1.00 mg/dL   eGFR 79 >40 fO/fpw/8.26   BUN/Creatinine Ratio 11 9 - 23   Sodium 142 134 - 144 mmol/L   Potassium 4.8 3.5 - 5.2 mmol/L   Chloride 106 96 - 106 mmol/L   CO2 21 20 - 29 mmol/L   Calcium  9.7 8.7 - 10.2 mg/dL   Total Protein 7.0 6.0 - 8.5 g/dL   Albumin 4.0 3.8 - 4.9 g/dL   Globulin, Total 3.0 1.5 - 4.5 g/dL   Bilirubin Total 0.4 0.0 - 1.2 mg/dL   Alkaline Phosphatase 95 49 - 135 IU/L   AST 27 0 - 40 IU/L   ALT 25 0 - 32 IU/L  Lipid Panel   Collection Time: 11/15/23 11:30 AM  Result  Value Ref Range   Cholesterol, Total 139 100 - 199 mg/dL   Triglycerides 809 (H) 0 - 149 mg/dL   HDL 42 >60 mg/dL   VLDL Cholesterol Cal 32 5 - 40 mg/dL   LDL Chol Calc (NIH) 65 0 - 99 mg/dL   Chol/HDL Ratio 3.3 0.0 - 4.4 ratio  Hemoglobin A1c   Collection Time: 11/15/23 11:30 AM  Result Value Ref Range   Hgb A1c MFr Bld 6.1 (H) 4.8 - 5.6 %   Est. average glucose Bld gHb Est-mCnc 128 mg/dL  Hepatitis C antibody   Collection Time: 11/15/23 11:30 AM  Result Value Ref Range   Hep C Virus Ab Non Reactive Non Reactive  HIV Antibody (routine testing w rflx)   Collection Time: 11/15/23 11:30 AM  Result Value Ref Range   HIV Screen 4th Generation wRfx Non Reactive Non Reactive  .  Assessment & Plan:   Assessment & Plan Primary hypertension  hypertension Blood pressure is well controlled with current medication regimen of Coreg  6.25 mg TWICE DAILY. - Continue current antihypertensive medication regimen.    Prediabetes Prediabetes Blood sugar levels are consistent with borderline diabetes (HbA1c 6.1). Weight loss and dietary changes are being implemented to manage condition. - Continue dietary modifications and weight loss efforts to manage blood sugar levels.    Mixed hyperlipidemia Mixed hyperlipidemia Triglycerides were slightly elevated in previous blood work. Current medication includes Crestor . - Continue Crestor  10 mg daily.    Class 2 severe obesity due to excess calories with serious comorbidity and body mass index (BMI) of 38.0 to 38.9 in adult Class 2 obesity with comorbidities of hyperlipidemia and hypertension,  BMI has decreased from class 3 to class 2 obesity. She has lost 4-5 pounds since September and is actively trying to lose weight through dietary changes and increased physical activity. She is not eligible for GLP-1 injections due to lack of diabetes or heart disease. Discussed potential use of weight loss medications if she struggles with hunger or portion  control, but she prefers to continue with lifestyle modifications. Medications could raise blood pressure, cause dry mouth, or sleep disturbances. - Continue dietary modifications including reducing soda and bread intake, and increasing vegetable consumption. - Incorporate protein into diet to help with satiety. - Encourage regular physical activity, including walking and strength training with light weights. - Will consider weight loss medications if lifestyle modifications are insufficient.  Chronic low back pain Persists with variable intensity. Pain management includes Voltaren gel and Tylenol  as needed. - Continue current pain management with Voltaren gel and Tylenol  as needed.  Chronic bilateral knee pain Persists, limiting physical activity. Pain management includes Voltaren gel and Tylenol  as needed. - Continue current pain management with Voltaren gel and Tylenol  as needed.  General health maintenance She is up to date with mammograms and is due for a Medicare wellness visit. - Will schedule Medicare wellness visit, either in person or via telephone. - Continue regular health screenings as scheduled.        Body mass index is 38.69 kg/m.        No orders of the defined types were placed in this encounter.   No orders of the defined types were placed in this encounter.      Follow-up: No follow-ups on file.  An After Visit Summary was printed and given to the patient.  Mikeyla Music, MD [facility] [phone]     [1]  Current Outpatient Medications on File Prior to Visit  Medication Sig Dispense Refill   acetaminophen  (TYLENOL ) 325 MG tablet Take 650 mg by mouth every 6 (six) hours as needed for mild pain.     carvedilol  (COREG ) 6.25 MG tablet Take 1 tablet by mouth twice daily 180 tablet 3   diclofenac Sodium (VOLTAREN) 1 % GEL Apply 2 g topically 4 (four) times daily as needed for pain.     famotidine  (PEPCID ) 20 MG tablet Take 1  tablet (20 mg total) by mouth at bedtime. 90 tablet 0   rosuvastatin  (CRESTOR ) 10 MG tablet Take 1 tablet (10 mg total) by mouth daily. 90 tablet 3   No current facility-administered medications on file prior to visit.   "

## 2024-02-20 NOTE — Assessment & Plan Note (Addendum)
 Prediabetes Blood sugar levels are consistent with borderline diabetes (HbA1c 6.1). Weight loss and dietary changes are being implemented to manage condition. - Continue dietary modifications and weight loss efforts to manage blood sugar levels.

## 2024-02-20 NOTE — Assessment & Plan Note (Addendum)
 Class 2 obesity with comorbidities of hyperlipidemia and hypertension,  BMI has decreased from class 3 to class 2 obesity. She has lost 4-5 pounds since September and is actively trying to lose weight through dietary changes and increased physical activity. She is not eligible for GLP-1 injections due to lack of diabetes or heart disease. Discussed potential use of weight loss medications if she struggles with hunger or portion control, but she prefers to continue with lifestyle modifications. Medications could raise blood pressure, cause dry mouth, or sleep disturbances. - Continue dietary modifications including reducing soda and bread intake, and increasing vegetable consumption. - Incorporate protein into diet to help with satiety. - Encourage regular physical activity, including walking and strength training with light weights. - Will consider weight loss medications if lifestyle modifications are insufficient.

## 2024-02-20 NOTE — Assessment & Plan Note (Addendum)
 Mixed hyperlipidemia Triglycerides were slightly elevated in previous blood work. Current medication includes Crestor . - Continue Crestor  10 mg daily.

## 2024-02-20 NOTE — Assessment & Plan Note (Addendum)
"   hypertension Blood pressure is well controlled with current medication regimen of Coreg  6.25 mg TWICE DAILY. - Continue current antihypertensive medication regimen.    "

## 2024-03-14 ENCOUNTER — Ambulatory Visit

## 2024-03-14 VITALS — BP 132/77 | HR 50 | Temp 97.4°F | Ht 67.0 in | Wt 246.0 lb

## 2024-03-14 DIAGNOSIS — Z0000 Encounter for general adult medical examination without abnormal findings: Secondary | ICD-10-CM | POA: Diagnosis not present

## 2024-03-14 NOTE — Patient Instructions (Signed)
 Autumn [name] , Thank you for taking time to come for your Medicare Wellness Visit. I appreciate your ongoing commitment to your health goals. Please review the following plan we discussed and let me know if I can assist you in the future.   These are the goals we discussed:  Goals       Weight (lb) < 185 lb (83.9 kg) (pt-stated)      Patient would like to lose weight. Goal weight of 185.0 lb.        This is a list of the screening recommended for you and due dates:  Health Maintenance  Topic Date Due   COVID-19 Vaccine (2 - Janssen risk series) 07/19/2019   Zoster (Shingles) Vaccine (1 of 2) 05/20/2024*   Hepatitis B Vaccine (1 of 3 - 19+ 3-dose series) 11/14/2024*   Medicare Annual Wellness Visit  03/14/2025   Breast Cancer Screening  07/26/2025   Colon Cancer Screening  12/05/2029   DTaP/Tdap/Td vaccine (2 - Td or Tdap) 02/19/2032   Pneumococcal Vaccine for age over 69  Completed   Flu Shot  Completed   HPV Vaccine (No Doses Required) Completed   Hepatitis C Screening  Completed   HIV Screening  Completed   Meningitis B Vaccine  Aged Out  *Topic was postponed. The date shown is not the original due date.    Advanced directives: no, patient declined at this time.    Next appointment: Follow up in one year for your annual wellness visit. 02.03.2027 at 9 am (telephone visit)  Preventive Care 40-64 Years, Female Preventive care refers to lifestyle choices and visits with your health care provider that can promote health and wellness. What does preventive care include? A yearly physical exam. This is also called an annual well check. Dental exams once or twice a year. Routine eye exams. Ask your health care provider how often you should have your eyes checked. Personal lifestyle choices, including: Daily care of your teeth and gums. Regular physical activity. Eating a healthy diet. Avoiding tobacco and drug use. Limiting alcohol  use. Practicing safe sex. Taking low-dose  aspirin  daily starting at age 47. Taking vitamin and mineral supplements as recommended by your health care provider. What happens during an annual well check? The services and screenings done by your health care provider during your annual well check will depend on your age, overall health, lifestyle risk factors, and family history of disease. Counseling  Your health care provider may ask you questions about your: Alcohol  use. Tobacco use. Drug use. Emotional well-being. Home and relationship well-being. Sexual activity. Eating habits. Work and work astronomer. Method of birth control. Menstrual cycle. Pregnancy history. Screening  You may have the following tests or measurements: Height, weight, and BMI. Blood pressure. Lipid and cholesterol levels. These may be checked every 5 years, or more frequently if you are over 58 years old. Skin check. Lung cancer screening. You may have this screening every year starting at age 60 if you have a 30-pack-year history of smoking and currently smoke or have quit within the past 15 years. Fecal occult blood test (FOBT) of the stool. You may have this test every year starting at age 70. Flexible sigmoidoscopy or colonoscopy. You may have a sigmoidoscopy every 5 years or a colonoscopy every 10 years starting at age 65. Hepatitis C blood test. Hepatitis B blood test. Sexually transmitted disease (STD) testing. Diabetes screening. This is done by checking your blood sugar (glucose) after you have not eaten for a  while (fasting). You may have this done every 1-3 years. Mammogram. This may be done every 1-2 years. Talk to your health care provider about when you should start having regular mammograms. This may depend on whether you have a family history of breast cancer. BRCA-related cancer screening. This may be done if you have a family history of breast, ovarian, tubal, or peritoneal cancers. Pelvic exam and Pap test. This may be done every 3  years starting at age 59. Starting at age 76, this may be done every 5 years if you have a Pap test in combination with an HPV test. Bone density scan. This is done to screen for osteoporosis. You may have this scan if you are at high risk for osteoporosis. Discuss your test results, treatment options, and if necessary, the need for more tests with your health care provider. Vaccines  Your health care provider may recommend certain vaccines, such as: Influenza vaccine. This is recommended every year. Tetanus, diphtheria, and acellular pertussis (Tdap, Td) vaccine. You may need a Td booster every 10 years. Zoster vaccine. You may need this after age 4. Pneumococcal 13-valent conjugate (PCV13) vaccine. You may need this if you have certain conditions and were not previously vaccinated. Pneumococcal polysaccharide (PPSV23) vaccine. You may need one or two doses if you smoke cigarettes or if you have certain conditions. Talk to your health care provider about which screenings and vaccines you need and how often you need them. This information is not intended to replace advice given to you by your health care provider. Make sure you discuss any questions you have with your health care provider. Document Released: 02/28/2015 Document Revised: 10/22/2015 Document Reviewed: 12/03/2014 Elsevier Interactive Patient Education  2017 [employer].    Fall Prevention in the Home Falls can cause injuries. They can happen to people of all ages. There are many things you can do to make your home safe and to help prevent falls. What can I do on the outside of my home? Regularly fix the edges of walkways and driveways and fix any cracks. Remove anything that might make you trip as you walk through a door, such as a raised step or threshold. Trim any bushes or trees on the path to your home. Use bright outdoor lighting. Clear any walking paths of anything that might make someone trip, such as rocks or  tools. Regularly check to see if handrails are loose or broken. Make sure that both sides of any steps have handrails. Any raised decks and porches should have guardrails on the edges. Have any leaves, snow, or ice cleared regularly. Use sand or salt on walking paths during winter. Clean up any spills in your garage right away. This includes oil or grease spills. What can I do in the bathroom? Use night lights. Install grab bars by the toilet and in the tub and shower. Do not use towel bars as grab bars. Use non-skid mats or decals in the tub or shower. If you need to sit down in the shower, use a plastic, non-slip stool. Keep the floor dry. Clean up any water that spills on the floor as soon as it happens. Remove soap buildup in the tub or shower regularly. Attach bath mats securely with double-sided non-slip rug tape. Do not have throw rugs and other things on the floor that can make you trip. What can I do in the bedroom? Use night lights. Make sure that you have a light by your bed that is easy  to reach. Do not use any sheets or blankets that are too big for your bed. They should not hang down onto the floor. Have a firm chair that has side arms. You can use this for support while you get dressed. Do not have throw rugs and other things on the floor that can make you trip. What can I do in the kitchen? Clean up any spills right away. Avoid walking on wet floors. Keep items that you use a lot in easy-to-reach places. If you need to reach something above you, use a strong step stool that has a grab bar. Keep electrical cords out of the way. Do not use floor polish or wax that makes floors slippery. If you must use wax, use non-skid floor wax. Do not have throw rugs and other things on the floor that can make you trip. What can I do with my stairs? Do not leave any items on the stairs. Make sure that there are handrails on both sides of the stairs and use them. Fix handrails that are  broken or loose. Make sure that handrails are as long as the stairways. Check any carpeting to make sure that it is firmly attached to the stairs. Fix any carpet that is loose or worn. Avoid having throw rugs at the top or bottom of the stairs. If you do have throw rugs, attach them to the floor with carpet tape. Make sure that you have a light switch at the top of the stairs and the bottom of the stairs. If you do not have them, ask someone to add them for you. What else can I do to help prevent falls? Wear shoes that: Do not have high heels. Have rubber bottoms. Are comfortable and fit you well. Are closed at the toe. Do not wear sandals. If you use a stepladder: Make sure that it is fully opened. Do not climb a closed stepladder. Make sure that both sides of the stepladder are locked into place. Ask someone to hold it for you, if possible. Clearly mark and make sure that you can see: Any grab bars or handrails. First and last steps. Where the edge of each step is. Use tools that help you move around (mobility aids) if they are needed. These include: Canes. Walkers. Scooters. Crutches. Turn on the lights when you go into a dark area. Replace any light bulbs as soon as they burn out. Set up your furniture so you have a clear path. Avoid moving your furniture around. If any of your floors are uneven, fix them. If there are any pets around you, be aware of where they are. Review your medicines with your doctor. Some medicines can make you feel dizzy. This can increase your chance of falling. Ask your doctor what other things that you can do to help prevent falls. This information is not intended to replace advice given to you by your health care provider. Make sure you discuss any questions you have with your health care provider. Document Released: 11/28/2008 Document Revised: 07/10/2015 Document Reviewed: 03/08/2014 Elsevier Interactive Patient Education  2017 [employer].

## 2024-03-14 NOTE — Progress Notes (Signed)
 "  Chief Complaint  Patient presents with   Annual Exam     Subjective:   Autumn [name] is a 59 y.o. female who presents for a Medicare Annual Wellness Visit.  Visit info / Clinical Intake: Medicare Wellness Visit Type:: Subsequent Annual Wellness Visit Persons participating in visit and providing information:: patient Medicare Wellness Visit Mode:: Telephone If telephone:: video error Since this visit was completed virtually, some vitals may be partially provided or unavailable. Missing vitals are due to the limitations of the virtual format.: Documented vitals are patient reported If Telephone or Video please confirm:: I connected with patient using audio/video enable telemedicine. I verified patient identity with two identifiers, discussed telehealth limitations, and patient agreed to proceed. Patient Location:: home Provider Location:: office Interpreter Needed?: No Pre-visit prep was completed: no AWV questionnaire completed by patient prior to visit?: no Living arrangements:: lives with spouse/significant other Patient's Overall Health Status Rating: good Typical amount of pain: none Does pain affect daily life?: no Are you currently prescribed opioids?: no  Dietary Habits and Nutritional Risks How many meals a day?: 3 Eats fruit and vegetables daily?: (!) no (3x weekly) Most meals are obtained by: preparing own meals; eating out; having others provide food In the last 2 weeks, have you had any of the following?: none Diabetic:: no  Functional Status Activities of Daily Living (to include ambulation/medication): Independent Ambulation: Independent Medication Administration: Independent Home Management (perform basic housework or laundry): Independent Manage your own finances?: yes Primary transportation is: driving Concerns about vision?: no *vision screening is required for WTM* Concerns about hearing?: no  Fall Screening Falls in the past year?: 0 Number of falls  in past year: 0 Was there an injury with Fall?: 0 Fall Risk Category Calculator: 0 Patient Fall Risk Level: Low Fall Risk  Fall Risk Patient at Risk for Falls Due to: No Fall Risks Fall risk Follow up: Falls evaluation completed  Home and Transportation Safety: All rugs have non-skid backing?: yes All stairs or steps have railings?: yes Grab bars in the bathtub or shower?: (!) no Have non-skid surface in bathtub or shower?: yes Good home lighting?: yes Regular seat belt use?: yes Hospital stays in the last year:: no  Cognitive Assessment Difficulty concentrating, remembering, or making decisions? : no Will 6CIT or Mini Cog be Completed: yes What year is it?: 0 points What month is it?: 0 points Give patient an address phrase to remember (5 components): [address] [place] About what time is it?: 0 points Count backwards from 20 to 1: 0 points Say the months of the year in reverse: 0 points Repeat the address phrase from earlier: 0 points 6 CIT Score: 0 points  Advance Directives (For Healthcare) Does Patient Have a Medical Advance Directive?: No Would patient like information on creating a medical advance directive?: No - Patient declined  Reviewed/Updated  Reviewed/Updated: Reviewed All (Medical, Surgical, Family, Medications, Allergies, Care Teams, Patient Goals)    Allergies (verified) Patient has no known allergies.   Current Medications (verified) Outpatient Encounter Medications as of 03/14/2024  Medication Sig   acetaminophen  (TYLENOL ) 325 MG tablet Take 650 mg by mouth every 6 (six) hours as needed for mild pain.   carvedilol  (COREG ) 6.25 MG tablet Take 1 tablet by mouth twice daily   diclofenac Sodium (VOLTAREN) 1 % GEL Apply 2 g topically 4 (four) times daily as needed for pain.   famotidine  (PEPCID ) 20 MG tablet Take 1 tablet (20 mg total) by mouth at  bedtime.   rosuvastatin  (CRESTOR ) 10 MG tablet Take 1 tablet (10 mg total) by mouth daily.   No  facility-administered encounter medications on file as of 03/14/2024.    History: Past Medical History:  Diagnosis Date   Abdominal pain, acute, epigastric 04/16/2012   Acute medial meniscus tear of left knee 11/06/2020   Arthritis    Chest pain on exertion 04/16/2012   Diverticulitis    last episode the sat before thanksgiving   Diverticulosis    Gallstones    History of kidney stones    Hx of adenomatous colonic polyps 06/23/2012   March 2012 - ascending colon polyp 1.2 cm = tubular adenoma (repeat March 2015)   Hypertension    Lower abdominal pain 08/25/2020   Morbid obesity with BMI of 40.0-44.9, adult (HCC) 04/16/2012   Osteoarthritis    Pre-diabetes    Past Surgical History:  Procedure Laterality Date   ABDOMINAL HYSTERECTOMY     bladder tack  02/15/2005   CHOLECYSTECTOMY     KNEE ARTHROSCOPY WITH MENISCAL REPAIR Left 11/19/2020   Procedure: LEFT KNEE ARTHROSCOPY PARTIAL MEDIAL MENISCECTOMY;  Surgeon: Jerri Kay HERO, MD;  Location: [facility];  Service: Orthopedics;  Laterality: Left;   LAPAROSCOPIC TOTAL HYSTERECTOMY  2006   LEFT HEART CATH AND CORONARY ANGIOGRAPHY N/A 01/25/2022   Procedure: LEFT HEART CATH AND CORONARY ANGIOGRAPHY;  Surgeon: Wonda Sharper, MD;  Location: [facility];  Service: Cardiovascular;  Laterality: N/A;   Family History  Problem Relation Age of Onset   Hypertension Mother    Diabetes Mother    Lymphoma Mother    Thyroid  disease Mother    Brain cancer Mother    Cancer Mother    Lung cancer Father    Cancer Father    Heart murmur Brother    Pancreatic cancer Paternal Aunt    Cancer Paternal Aunt        unknown type   Stomach cancer Paternal Uncle    Dementia Maternal Grandfather    Colon cancer Neg Hx    Rectal cancer Neg Hx    Social History   Occupational History   Occupation: ASSOCIATE    Employer: [employer]  Tobacco Use   Smoking status: Never   Smokeless tobacco: Never  Vaping Use   Vaping status:  Never Used  Substance and Sexual Activity   Alcohol  use: Never   Drug use: Never   Sexual activity: Yes    Partners: Male    Birth control/protection: None   Tobacco Counseling Counseling given: Not Answered  SDOH Screenings   Food Insecurity: No Food Insecurity (03/14/2024)  Housing: Unknown (03/14/2024)  Transportation Needs: No Transportation Needs (03/14/2024)  Utilities: Not At Risk (03/14/2024)  Alcohol  Screen: Low Risk (11/15/2023)  Depression (PHQ2-9): Low Risk (03/14/2024)  Financial Resource Strain: Low Risk (11/14/2023)  Physical Activity: Insufficiently Active (03/14/2024)  Social Connections: Moderately Integrated (03/14/2024)  Stress: No Stress Concern Present (03/14/2024)  Tobacco Use: Low Risk (03/14/2024)  Health Literacy: Adequate Health Literacy (03/14/2024)   See flowsheets for full screening details  Depression Screen PHQ 2 & 9 Depression Scale- Over the past 2 weeks, how often have you been bothered by any of the following problems? Little interest or pleasure in doing things: 0 Feeling down, depressed, or hopeless (PHQ Adolescent also includes...irritable): 0 PHQ-2 Total Score: 0 Trouble falling or staying asleep, or sleeping too much: 0 Feeling tired or having little energy: 0 Poor appetite or overeating (PHQ Adolescent also includes...weight loss): 0  Feeling bad about yourself - or that you are a failure or have let yourself or your family down: 0 Trouble concentrating on things, such as reading the newspaper or watching television (PHQ Adolescent also includes...like school work): 0 Moving or speaking so slowly that other people could have noticed. Or the opposite - being so fidgety or restless that you have been moving around a lot more than usual: 0 Thoughts that you would be better off dead, or of hurting yourself in some way: 0 PHQ-9 Total Score: 0 If you checked off any problems, how difficult have these problems made it for you to do your work, take care  of things at home, or get along with other people?: Not difficult at all  Depression Treatment Depression Interventions/Treatment : EYV7-0 Score <4 Follow-up Not Indicated     Goals Addressed               This Visit's Progress     Weight (lb) < 185 lb (83.9 kg) (pt-stated)   246 lb (111.6 kg)     Patient would like to lose weight. Goal weight of 185.0 lb.             Objective:    Today's Vitals   03/14/24 0854  BP: 132/77  Pulse: (!) 50  Temp: (!) 97.4 F (36.3 C)  SpO2: 97%  Weight: 246 lb (111.6 kg)  Height: 5' 7 (1.702 m)   Body mass index is 38.53 kg/m.  Hearing/Vision screen No results found. Immunizations and Health Maintenance Health Maintenance  Topic Date Due   COVID-19 Vaccine (2 - Janssen risk series) 07/19/2019   Zoster Vaccines- Shingrix (1 of 2) 05/20/2024 (Originally 04/08/1984)   Hepatitis B Vaccines 19-59 Average Risk (1 of 3 - 19+ 3-dose series) 11/14/2024 (Originally 04/08/1984)   Medicare Annual Wellness (AWV)  03/14/2025   Mammogram  07/26/2025   Colonoscopy  12/05/2029   DTaP/Tdap/Td (2 - Td or Tdap) 02/19/2032   Pneumococcal Vaccine: 50+ Years  Completed   Influenza Vaccine  Completed   HPV VACCINES (No Doses Required) Completed   Hepatitis C Screening  Completed   HIV Screening  Completed   Meningococcal B Vaccine  Aged Out        Assessment/Plan:  This is a routine wellness examination for Okay.  Patient Care Team: Sirivol, Mamatha, MD as PCP - General (Family Medicine) Tobb, Kardie, DO as PCP - Cardiology (Cardiology)  I have personally reviewed and noted the following in the patients chart:   Medical and social history Use of alcohol , tobacco or illicit drugs  Current medications and supplements including opioid prescriptions. Functional ability and status Nutritional status Physical activity Advanced directives List of other physicians Hospitalizations, surgeries, and ER visits in previous 12  months Vitals Screenings to include cognitive, depression, and falls Referrals and appointments  No orders of the defined types were placed in this encounter.  In addition, I have reviewed and discussed with patient certain preventive protocols, quality metrics, and best practice recommendations. A written personalized care plan for preventive services as well as general preventive health recommendations were provided to patient.   [place] Mailman, [place]   03/14/2024   Return in 1 year (on 03/14/2025).  After Visit Summary: (MyChart) Due to this being a telephonic visit, the after visit summary with patients personalized plan was offered to patient via MyChart   Nurse Notes: I spent 20 minutes with patient. Patient has on questions or concerns at this time.  "

## 2024-03-20 ENCOUNTER — Other Ambulatory Visit: Payer: Self-pay | Admitting: Cardiology

## 2024-04-10 ENCOUNTER — Ambulatory Visit: Admitting: Cardiology

## 2024-05-29 ENCOUNTER — Ambulatory Visit

## 2025-03-20 ENCOUNTER — Ambulatory Visit
